# Patient Record
Sex: Female | Born: 1975 | Race: White | Hispanic: No | Marital: Married | Smoking: Former smoker
Health system: Southern US, Community
[De-identification: ages and names within clinical notes are randomized; demographics above are authoritative.]

## PROBLEM LIST (undated history)

## (undated) DIAGNOSIS — F419 Anxiety disorder, unspecified: Secondary | ICD-10-CM

## (undated) DIAGNOSIS — G43909 Migraine, unspecified, not intractable, without status migrainosus: Secondary | ICD-10-CM

## (undated) DIAGNOSIS — I10 Essential (primary) hypertension: Secondary | ICD-10-CM

## (undated) DIAGNOSIS — T7840XA Allergy, unspecified, initial encounter: Secondary | ICD-10-CM

## (undated) DIAGNOSIS — F32A Depression, unspecified: Secondary | ICD-10-CM

## (undated) DIAGNOSIS — E785 Hyperlipidemia, unspecified: Secondary | ICD-10-CM

## (undated) DIAGNOSIS — F329 Major depressive disorder, single episode, unspecified: Secondary | ICD-10-CM

## (undated) HISTORY — DX: Essential (primary) hypertension: I10

## (undated) HISTORY — PX: TUBAL LIGATION: SHX77

## (undated) HISTORY — DX: Major depressive disorder, single episode, unspecified: F32.9

## (undated) HISTORY — DX: Allergy, unspecified, initial encounter: T78.40XA

## (undated) HISTORY — DX: Hyperlipidemia, unspecified: E78.5

## (undated) HISTORY — DX: Depression, unspecified: F32.A

## (undated) HISTORY — DX: Anxiety disorder, unspecified: F41.9

## (undated) HISTORY — PX: BUNIONECTOMY: SHX129

---

## 2003-06-12 ENCOUNTER — Other Ambulatory Visit: Admission: RE | Admit: 2003-06-12 | Discharge: 2003-06-12 | Payer: Self-pay | Admitting: Obstetrics and Gynecology

## 2003-11-25 ENCOUNTER — Emergency Department (HOSPITAL_COMMUNITY): Admission: AD | Admit: 2003-11-25 | Discharge: 2003-11-25 | Payer: Self-pay | Admitting: Family Medicine

## 2004-08-01 ENCOUNTER — Other Ambulatory Visit: Admission: RE | Admit: 2004-08-01 | Discharge: 2004-08-01 | Payer: Self-pay | Admitting: Obstetrics and Gynecology

## 2005-09-03 ENCOUNTER — Other Ambulatory Visit: Admission: RE | Admit: 2005-09-03 | Discharge: 2005-09-03 | Payer: Self-pay | Admitting: Obstetrics and Gynecology

## 2006-06-19 ENCOUNTER — Inpatient Hospital Stay (HOSPITAL_COMMUNITY): Admission: AD | Admit: 2006-06-19 | Discharge: 2006-06-22 | Payer: Self-pay | Admitting: Obstetrics & Gynecology

## 2008-08-04 ENCOUNTER — Emergency Department (HOSPITAL_BASED_OUTPATIENT_CLINIC_OR_DEPARTMENT_OTHER): Admission: EM | Admit: 2008-08-04 | Discharge: 2008-08-04 | Payer: Self-pay | Admitting: Emergency Medicine

## 2010-05-01 ENCOUNTER — Observation Stay (HOSPITAL_COMMUNITY): Admission: AD | Admit: 2010-05-01 | Discharge: 2010-05-02 | Payer: Self-pay | Admitting: Obstetrics and Gynecology

## 2010-05-03 ENCOUNTER — Inpatient Hospital Stay (HOSPITAL_COMMUNITY): Admission: AD | Admit: 2010-05-03 | Discharge: 2010-05-03 | Payer: Self-pay | Admitting: Obstetrics and Gynecology

## 2010-05-10 ENCOUNTER — Inpatient Hospital Stay (HOSPITAL_COMMUNITY): Admission: AD | Admit: 2010-05-10 | Discharge: 2010-05-10 | Payer: Self-pay | Admitting: Obstetrics and Gynecology

## 2010-05-11 ENCOUNTER — Ambulatory Visit (HOSPITAL_COMMUNITY): Admission: AD | Admit: 2010-05-11 | Discharge: 2010-05-11 | Payer: Self-pay | Admitting: Obstetrics and Gynecology

## 2010-05-18 ENCOUNTER — Inpatient Hospital Stay (HOSPITAL_COMMUNITY): Admission: RE | Admit: 2010-05-18 | Discharge: 2010-05-20 | Payer: Self-pay | Admitting: Obstetrics and Gynecology

## 2011-03-17 LAB — CBC
HCT: 26.2 % — ABNORMAL LOW (ref 36.0–46.0)
HCT: 32.6 % — ABNORMAL LOW (ref 36.0–46.0)
Hemoglobin: 11.4 g/dL — ABNORMAL LOW (ref 12.0–15.0)
Hemoglobin: 9 g/dL — ABNORMAL LOW (ref 12.0–15.0)
MCV: 87.2 fL (ref 78.0–100.0)
Platelets: 158 10*3/uL (ref 150–400)
Platelets: 195 10*3/uL (ref 150–400)
RBC: 3 MIL/uL — ABNORMAL LOW (ref 3.87–5.11)
RBC: 3.83 MIL/uL — ABNORMAL LOW (ref 3.87–5.11)
RDW: 14.5 % (ref 11.5–15.5)
WBC: 10.4 10*3/uL (ref 4.0–10.5)
WBC: 7.9 10*3/uL (ref 4.0–10.5)

## 2011-03-17 LAB — RPR: RPR Ser Ql: NONREACTIVE

## 2011-03-18 LAB — URINE MICROSCOPIC-ADD ON

## 2011-03-18 LAB — CREATININE CLEARANCE, URINE, 24 HOUR
Collection Interval-CRCL: 24 hours
Creatinine Clearance: 164 mL/min — ABNORMAL HIGH (ref 75–115)
Creatinine, Urine: 45.9 mg/dL
Urine Total Volume-CRCL: 2725 mL

## 2011-03-18 LAB — COMPREHENSIVE METABOLIC PANEL
AST: 17 U/L (ref 0–37)
Albumin: 2.5 g/dL — ABNORMAL LOW (ref 3.5–5.2)
Alkaline Phosphatase: 137 U/L — ABNORMAL HIGH (ref 39–117)
Alkaline Phosphatase: 139 U/L — ABNORMAL HIGH (ref 39–117)
BUN: 4 mg/dL — ABNORMAL LOW (ref 6–23)
BUN: 4 mg/dL — ABNORMAL LOW (ref 6–23)
CO2: 22 mEq/L (ref 19–32)
Calcium: 8.5 mg/dL (ref 8.4–10.5)
Chloride: 106 mEq/L (ref 96–112)
Creatinine, Ser: 0.5 mg/dL (ref 0.4–1.2)
Creatinine, Ser: 0.64 mg/dL (ref 0.4–1.2)
GFR calc non Af Amer: >60 mL/min (ref >60)
GFR calc non Af Amer: >60 mL/min (ref >60)
Potassium: 3.5 mEq/L (ref 3.5–5.1)
Sodium: 134 mEq/L — ABNORMAL LOW (ref 135–145)
Sodium: 135 mEq/L (ref 135–145)
Sodium: 137 mEq/L (ref 135–145)
Total Bilirubin: 0.2 mg/dL — ABNORMAL LOW (ref 0.3–1.2)

## 2011-03-18 LAB — CBC
HCT: 29.8 % — ABNORMAL LOW (ref 36.0–46.0)
HCT: 32 % — ABNORMAL LOW (ref 36.0–46.0)
Hemoglobin: 10.3 g/dL — ABNORMAL LOW (ref 12.0–15.0)
Hemoglobin: 10.4 g/dL — ABNORMAL LOW (ref 12.0–15.0)
MCV: 87.9 fL (ref 78.0–100.0)
MCV: 88 fL (ref 78.0–100.0)
Platelets: 171 10*3/uL (ref 150–400)
Platelets: 180 10*3/uL (ref 150–400)
RBC: 3.4 MIL/uL — ABNORMAL LOW (ref 3.87–5.11)
RDW: 14.5 % (ref 11.5–15.5)
RDW: 14.6 % (ref 11.5–15.5)
WBC: 11.8 10*3/uL — ABNORMAL HIGH (ref 4.0–10.5)

## 2011-03-18 LAB — PROTEIN, URINE, 24 HOUR
Protein, 24H Urine: 136 mg/d — ABNORMAL HIGH (ref 50–100)
Urine Total Volume-UPROT: 2725 mL

## 2011-03-18 LAB — URIC ACID
Uric Acid, Serum: 4.6 mg/dL (ref 2.4–7.0)
Uric Acid, Serum: 4.6 mg/dL (ref 2.4–7.0)

## 2011-03-18 LAB — URINALYSIS, ROUTINE W REFLEX MICROSCOPIC
Bilirubin Urine: NEGATIVE
Glucose, UA: 250 mg/dL — AB
Leukocytes, UA: NEGATIVE
Nitrite: NEGATIVE

## 2011-03-18 LAB — LACTATE DEHYDROGENASE: LDH: 134 U/L (ref 94–250)

## 2011-03-28 ENCOUNTER — Ambulatory Visit: Payer: Self-pay | Admitting: Internal Medicine

## 2011-04-11 DIAGNOSIS — E669 Obesity, unspecified: Secondary | ICD-10-CM | POA: Insufficient documentation

## 2011-04-11 DIAGNOSIS — G43909 Migraine, unspecified, not intractable, without status migrainosus: Secondary | ICD-10-CM | POA: Insufficient documentation

## 2011-04-11 DIAGNOSIS — F32A Depression, unspecified: Secondary | ICD-10-CM | POA: Insufficient documentation

## 2011-05-16 NOTE — Op Note (Signed)
NAMEBLONDELL, LAPERLE NO.:  [id number]   MEDICAL RECORD NO.:  [id number]          PATIENT TYPE:  INP   LOCATION:  9115                          FACILITY:  [facility]   PHYSICIAN:  Freddy Finner, M.D.   DATE OF BIRTH:  [birth year]   DATE OF PROCEDURE:  06/19/2006  DATE OF DISCHARGE:  06/22/2006                                 OPERATIVE REPORT   PREOPERATIVE DIAGNOSIS:  Surgically scared uterus, intrauterine pregnancy [redacted]  weeks gestation, in labor.   POSTOPERATIVE DIAGNOSIS:  Surgically scared uterus, intrauterine pregnancy  [redacted] weeks gestation, in labor.   OPERATIVE PROCEDURE:  Repeat low transverse cervical cesarean section,  delivery of a viable female infant, Apgars 9/9, arterial cord pH 7.35.   SURGEON:  Freddy Finner, M.D.   ANESTHESIA:  Spinal.   ESTIMATED BLOOD LOSS:  Less than or equal to 800 mL.   INTEROPERATIVE COMPLICATIONS:  None.   The medical record indicates that the note was dictated but apparently was  not found or I failed to dictate the note.   INDICATIONS FOR PROCEDURE:  The patient is a 35 year old who had previously  delivered by cesarean who presented in labor at [redacted] weeks gestation.   DESCRIPTION OF PROCEDURE:  After admission, appropriate preoperative labs,  she was brought to the operating room and placed under adequate spinal  anesthesia, placed in the dorsal recumbent position with elevation of the  right hip by 15 degrees.  The abdomen was prepped and draped in the usual  fashion and a Foley catheter placed using sterile technique.  A lower  abdominal transverse incision was made through an old scar and carried  sharply down to fascia.  The fascia was entered sharply and extended the  extent of the skin incision.  The rectus sheath was developed superiorly and  inferiorly with blunt and sharp dissection.  The rectus muscles were divided  in the midline.  The peritoneum was entered sharply and extended bluntly to  the extent of the  skin incision.  A transverse incision was made in the  visceroperitoneum overlying the lower uterine segment.  The bladder blade  was dissected off the lower segment.  A transverse incision was made in the  lower uterine segment and extended bluntly in a transverse direction.  A  viable female infant was delivered.  The placenta and other parts of  conception removed from the uterus.  The uterus was delivered on the  anterior abdominal wall.  The tubes and ovaries and uterus were normal.  The  uterine incision was closed in a double layer with running locking 0  Monocryl in the first layer and then imbricating 0 Monocryl sutures for the  second layer.  The bladder flap was reapproximated with an interrupted 0  Monocryl suture.  The uterus was delivered back into the abdominal cavity.  Hemostasis was complete.  Pack, needle, and instrument counts were correct.  Irrigation was carried out.  The abdominal incision was then closed in  layers with running 0 Monocryl used to close the peritoneum and  reapproximate the rectus muscle.  The fascia was closed with running 0 PDS,  the subcutaneous tissue was approximated with running 2-0 plain, the skin  was closed with skin staples and Steri-Strips.  The patient was taken to the  recovery room in good condition.      Freddy Finner, M.D.  Electronically Signed     WRN/MEDQ  D:  08/20/2006  T:  08/20/2006  Job:  [id number]

## 2011-05-16 NOTE — Discharge Summary (Signed)
Courtney Powers, CASAD NO.:  [id number]   MEDICAL RECORD NO.:  [id number]          PATIENT TYPE:  INP   LOCATION:  9115                          FACILITY:  [facility]   PHYSICIAN:  Zelphia Cairo, MD    DATE OF BIRTH:  February 08, 1976   DATE OF ADMISSION:  06/19/2006  DATE OF DISCHARGE:  06/22/2006                                 DISCHARGE SUMMARY   ADMITTING DIAGNOSES:  1.  Intrauterine pregnancy at term.  2.  Early labor.  3.  Previous cesarean section, desires repeat.   DISCHARGE DIAGNOSES:  1.  Status post low transverse cesarean section.  2.  Viable female infant.   PROCEDURE:  Repeat low transverse cesarean section.   REASON FOR ADMISSION:  Please see written H&P.   HOSPITAL COURSE:  The patient is 29-year white married female gravida 2,  para 1 that presented to [facility] in early labor.  The  patient had a previous cesarean section and desired repeat.  The patient was  then taken to the operating room where spinal anesthesia was administered  without difficulty.  A low transverse incision was made with delivery of a  viable female infant weighing 6 pounds 12 ounces with Apgars of 9 at one  minute and 9 at five minutes.  Arterial cord pH of 7.35.  The patient  tolerated the procedure well and was taken to the recovery room in stable  condition.  On postoperative day #1, the patient was without complaint.  Vital signs were stable.  Abdomen soft with good return of bowel function.  Fundus firm and nontender.  Abdominal dressing was noted to be clean, dry  and intact.  Laboratory findings revealed hemoglobin of 12.1.  On  postoperative day #2, the patient was without complaint.  Vital signs were  stable.  Fundus firm and nontender.  Abdominal dressing had been removed  revealing an incision that is clean, dry and intact.  On postoperative day  #3, the patient denied headache, blurred vision or right upper quadrant  pain.  Vital signs were stable.   She is afebrile.  Blood pressure was noted  to be 140/92 to 128/86.  Deep tendon reflexes 1+ without clonus.  Abdomen  soft.  Fundus was firm and nontender.  Incision was noted to have some  ecchymosis noted to be superior and inferior to the incisional site.  Staples were intact.  Due to history of PIH, the PIH labs were drawn stat,  which were within normal limits.  Discharge instructions were reviewed and  the patient was later discharged home.   CONDITION ON DISCHARGE:  Stable.   DIET:  Regular as tolerated.   ACTIVITY:  No heavy lifting, no driving x2 weeks.  No vaginal entry.   FOLLOWUP:  The patient is to follow up the following morning for a blood  pressure check.  She is to call for temperature greater than 100 degrees,  persistent nausea and vomiting, heavy vaginal bleeding and/or redness or  drainage from incisional site.  The patient was also instructed to call for  headache unrelieved by Tylenol  or blurred vision, right upper quadrant pain.   DISCHARGE MEDICATIONS:  1.  Percocet 5/325, #30 one p.o. every 4-6 hours p.r.n.  2.  Motrin 600 mg every 6 hours p.r.n.  3.  Prenatal vitamins one p.o. daily.  4.  Colace one p.o. daily p.r.n.      Julio Sicks, N.P.      Zelphia Cairo, MD  Electronically Signed    CC/MEDQ  D:  07/23/2006  T:  07/23/2006  Job:  [id number]

## 2011-07-04 DIAGNOSIS — J309 Allergic rhinitis, unspecified: Secondary | ICD-10-CM | POA: Insufficient documentation

## 2011-12-17 ENCOUNTER — Other Ambulatory Visit: Payer: Self-pay | Admitting: Gastroenterology

## 2011-12-19 ENCOUNTER — Other Ambulatory Visit: Payer: Self-pay

## 2012-01-01 ENCOUNTER — Encounter: Payer: Self-pay | Admitting: Internal Medicine

## 2012-02-06 ENCOUNTER — Ambulatory Visit: Payer: Self-pay | Admitting: Internal Medicine

## 2012-05-03 DIAGNOSIS — A048 Other specified bacterial intestinal infections: Secondary | ICD-10-CM | POA: Insufficient documentation

## 2013-02-07 DIAGNOSIS — E785 Hyperlipidemia, unspecified: Secondary | ICD-10-CM | POA: Insufficient documentation

## 2013-02-07 DIAGNOSIS — E559 Vitamin D deficiency, unspecified: Secondary | ICD-10-CM | POA: Insufficient documentation

## 2013-06-14 DIAGNOSIS — K219 Gastro-esophageal reflux disease without esophagitis: Secondary | ICD-10-CM | POA: Insufficient documentation

## 2013-06-14 DIAGNOSIS — F419 Anxiety disorder, unspecified: Secondary | ICD-10-CM | POA: Insufficient documentation

## 2013-10-21 ENCOUNTER — Other Ambulatory Visit: Payer: Self-pay | Admitting: Radiology

## 2013-10-21 ENCOUNTER — Ambulatory Visit (INDEPENDENT_AMBULATORY_CARE_PROVIDER_SITE_OTHER): Payer: 59 | Admitting: Internal Medicine

## 2013-10-21 VITALS — BP 140/82 | HR 84 | Temp 98.9°F | Resp 16 | Ht 62.0 in | Wt 190.2 lb

## 2013-10-21 DIAGNOSIS — H8110 Benign paroxysmal vertigo, unspecified ear: Secondary | ICD-10-CM

## 2013-10-21 DIAGNOSIS — M542 Cervicalgia: Secondary | ICD-10-CM

## 2013-10-21 DIAGNOSIS — R51 Headache: Secondary | ICD-10-CM

## 2013-10-21 MED ORDER — MECLIZINE HCL 32 MG PO TABS: ORAL_TABLET | ORAL | Status: DC

## 2013-10-21 NOTE — Progress Notes (Signed)
This chart was scribed for Ellamae Sia, MD by Joaquin Music, ED Scribe. This patient was seen in room Room 10 and the patient's care was started at 3:52 PM   Subjective:    Patient ID: Courtney Powers, female    DOB: 11/11/1976, 37 y.o.   MRN: [id number]  HPI Courtney Powers is a 37 y.o. female who presents to the [facility] complaining of ongoing dizziness onset a few weeks. Pt describes her dizzy episodes as feeling "drunk". Pt states a few weeks ago, she states she felt she had numbness in her face. She denies lost of taste buds, droopy face, and any other abnormalities. Pt states she was still able to eat normal. Pt states she has many HA and has neck complications. Pt does not believe her HA are associated with her dizzy episodes. Pt denies change in her hearing. Pt works all day at [employer]. Pt states when she has had X-Rays done, they have told her she has "whip lash neck". Pt states she has been taking OTC Ibuprofen daily to help with her HA with no relief.     Review of Systems  Constitutional: Negative for fever, chills and fatigue.  HENT: Negative for hearing loss.   Eyes: Negative for pain.  Neurological: Positive for dizziness, numbness and headaches.       Objective:   Physical Exam BP 140/82  Pulse 84  Temp(Src) 98.9 F (37.2 C) (Oral)  Resp 16  Ht 5\' 2"  (1.575 m)  Wt 190 lb 3.2 oz (86.274 kg)  BMI 34.78 kg/m2  SpO2 100%  LMP 10/07/2013 Pupils equal round reactive to light and accommodation /EOMs conjugate /conjunctiva clear  With eye exam, pt states she feels dizzy when she looks to the right rapidly. Nares clear Throat clear No nodes or thyromegaly She is tender in the paracervical muscles bilaterally and into the right trapezius There is forward posture Heart regular without murmur Lungs clear Extremities with good pulses and no edema  Cranial nerves II through XII intact Deep tendon reflexes symmetrical No distal sensory or motor losses Gait  normal Cerebellar intact Dix-Hallpike maneuver recreates dizziness but does not produce nystagmus       Assessment & Plan:  BPV (benign positional vertigo)  Brandt-Darhoff  Meclizine at bedtime  Recheck one week if not well to consider scans Headache(784.0)  Suspect this recurrent problem is secondary to her neck dysfunction Neck pain  Postural, secondary to muscle spasm  Recommend physical therapy Dr Joanne Gavel  Meds ordered this encounter  Medications  .  meclizine (ANTIVERT) 32 MG tablet    Sig: Hs daily    Dispense:  14 tablet    Refill:  0   Close followup

## 2013-10-21 NOTE — Telephone Encounter (Signed)
Meclizine not manufactured any longer in the 32 mg dose, pharmacy indicates they do have the 25 mg in stock, please advise pended

## 2013-10-23 DIAGNOSIS — R519 Headache, unspecified: Secondary | ICD-10-CM | POA: Insufficient documentation

## 2013-10-23 MED ORDER — MECLIZINE HCL 25 MG PO TABS: 25.0000 mg | ORAL_TABLET | Freq: Every day | ORAL | Status: DC

## 2013-10-23 NOTE — Telephone Encounter (Signed)
Sent in

## 2014-04-07 ENCOUNTER — Ambulatory Visit (INDEPENDENT_AMBULATORY_CARE_PROVIDER_SITE_OTHER): Payer: 59 | Admitting: Family Medicine

## 2014-04-07 VITALS — BP 128/76 | HR 82 | Temp 98.4°F | Resp 16 | Ht 61.5 in | Wt 200.4 lb

## 2014-04-07 DIAGNOSIS — R51 Headache: Secondary | ICD-10-CM

## 2014-04-07 DIAGNOSIS — M62838 Other muscle spasm: Secondary | ICD-10-CM

## 2014-04-07 MED ORDER — CYCLOBENZAPRINE HCL 10 MG PO TABS: 10.0000 mg | ORAL_TABLET | Freq: Two times a day (BID) | ORAL | Status: DC | PRN

## 2014-04-07 NOTE — Progress Notes (Signed)
[facility] [address], [place] [address] [phone]  Date:  04/07/2014   Name:  Courtney Powers   DOB:  [birth year]   MRN:  [id number]  PCP:  No primary provider on file.    Chief Complaint: Headache   History of Present Illness:  Courtney Powers is a 38 y.o. very pleasant female patient who presents with the following:  She is here today with a possible "tnesion headache" since Monday.  She has noted possible spasms in her neck. She has tried imitrex 3x and it has not helped- she does not think it is a migraine.  Last took imitrex yesterday.   The HA is located in the back left part of her head and goes into her shoulder.   NKI, no MVA She has been a little nauseated- thinks may be due to medication use.  No vomiting, no abdominal pain She does have some light sensitivity, no phonophobia.   No numbness, weakness or visual changes.    Her LMP is due soon, she is s/p BTL  Patient Active Problem List   Diagnosis Date Noted  . Frequent headaches 10/23/2013    Past Medical History  Diagnosis Date  . Allergy   . Anxiety   . Depression     Past Surgical History  Procedure Laterality Date  . Cesarean section    . Tubal ligation    . Bunionectomy Right     Right foot    History  Substance Use Topics  . Smoking status: Former Smoker    Types: Cigarettes    Quit date: 08/21/2013  . Smokeless tobacco: Not on file  . Alcohol Use: Yes     Comment: Social drinking    Family History  Problem Relation Age of Onset  . Heart disease Mother   . Hypertension Mother   . Hypertension Father   . Diabetes Father   . Alzheimer's disease Maternal Grandmother   . Heart disease Maternal Grandfather   . Cancer Paternal Grandmother   . Stroke Paternal Grandmother   . Hyperlipidemia Paternal Grandfather   . Diabetes Paternal Grandfather     No Known Allergies  Medication list has been reviewed and updated.  Current Outpatient Prescriptions on File  Prior to Visit  Medication Sig Dispense Refill  . SUMAtriptan (IMITREX) 100 MG tablet Take 100 mg by mouth every 2 (two) hours as needed for migraine. May repeat in 2 hours if headache persists or recurs.      . meclizine (ANTIVERT) 25 MG tablet Take 1 tablet (25 mg total) by mouth at bedtime.  14 tablet  0   No current facility-administered medications on file prior to visit.    Review of Systems:  As per HPI- otherwise negative.   Physical Examination: Filed Vitals:   04/07/14 1451  BP: 128/76  Pulse: 82  Temp: 98.4 F (36.9 C)  Resp: 16   Filed Vitals:   04/07/14 1451  Height: 5' 1.5" (1.562 m)  Weight: 200 lb 6.4 oz (90.901 kg)   Body mass index is 37.26 kg/(m^2). Ideal Body Weight: Weight in (lb) to have BMI = 25: 134.2  GEN: WDWN, NAD, Non-toxic, A & O x 3, obese, looks well HEENT: Atraumatic, Normocephalic. Neck supple. No masses, No LAD.  Bilateral TM wnl, oropharynx normal.  PEERL,EOMI.   No  Meningismus. She is tender over the left paracervical muscles and left upper trapezius.  Normal neck ROM Ears and Nose: No external  deformity. CV: RRR, No M/G/R. No JVD. No thrill. No extra heart sounds. PULM: CTA B, no wheezes, crackles, rhonchi. No retractions. No resp. distress. No accessory muscle use. ABD: S, NT, ND. No rebound. No HSM. EXTR: No c/c/e NEURO Normal gait. Normal complete neuro exam including sensation and movement all extremities and face, normal DTR all extremities and negative romberg PSYCH: Normally interactive. Conversant. Not depressed or anxious appearing.  Calm demeanor.   Assessment and Plan: Cervical paraspinal muscle spasm - Plan: cyclobenzaprine (FLEXERIL) 10 MG tablet  Headache(784.0)  Treat for cervical spasm and tension HA with flexeril as needed.  We are out of cervical collars- gave rx so she can get one at the drug store and hopefully bill her insurance.   She will let us know if not better soon- Sooner if worse.     Signed Abbe AmsterdamJessica  Iman Orourke, MD

## 2014-04-07 NOTE — Patient Instructions (Signed)
Use the flexeril as needed for muscle spasm and pain.  If a whole pill is too strong try a 1/2 pill.  Heat may also be helpful, and use your soft collar as needed.   If you are not better in the next few days please let me know- Sooner if worse.   It is better not to combine imitrex and flexeril- this can increase your risk of a rare reaction called serotonin syndrome.   Remember the flexeril will make you sleepy, so do not use it when you need to drive.

## 2015-06-20 ENCOUNTER — Other Ambulatory Visit: Payer: Self-pay | Admitting: Family Medicine

## 2015-10-02 ENCOUNTER — Encounter (HOSPITAL_BASED_OUTPATIENT_CLINIC_OR_DEPARTMENT_OTHER): Payer: Self-pay | Admitting: Emergency Medicine

## 2015-10-02 ENCOUNTER — Emergency Department (HOSPITAL_BASED_OUTPATIENT_CLINIC_OR_DEPARTMENT_OTHER)
Admission: EM | Admit: 2015-10-02 | Discharge: 2015-10-02 | Disposition: A | Discharge status: Home / Self Care | Payer: 59 | Attending: Emergency Medicine | Admitting: Emergency Medicine

## 2015-10-02 DIAGNOSIS — Z8659 Personal history of other mental and behavioral disorders: Secondary | ICD-10-CM | POA: Diagnosis not present

## 2015-10-02 DIAGNOSIS — R51 Headache: Secondary | ICD-10-CM | POA: Diagnosis present

## 2015-10-02 DIAGNOSIS — Z79899 Other long term (current) drug therapy: Secondary | ICD-10-CM | POA: Insufficient documentation

## 2015-10-02 DIAGNOSIS — R519 Headache, unspecified: Secondary | ICD-10-CM

## 2015-10-02 DIAGNOSIS — Z87891 Personal history of nicotine dependence: Secondary | ICD-10-CM | POA: Diagnosis not present

## 2015-10-02 DIAGNOSIS — G43909 Migraine, unspecified, not intractable, without status migrainosus: Secondary | ICD-10-CM | POA: Diagnosis not present

## 2015-10-02 HISTORY — DX: Migraine, unspecified, not intractable, without status migrainosus: G43.909

## 2015-10-02 MED ORDER — DIPHENHYDRAMINE HCL 50 MG/ML IJ SOLN
25.0000 mg | Freq: Once | INTRAMUSCULAR | Status: AC
  Administered 2015-10-02: 25 mg via INTRAVENOUS
  Filled 2015-10-02: qty 1

## 2015-10-02 MED ORDER — KETOROLAC TROMETHAMINE 30 MG/ML IJ SOLN
30.0000 mg | Freq: Once | INTRAMUSCULAR | Status: AC
  Administered 2015-10-02: 30 mg via INTRAVENOUS
  Filled 2015-10-02: qty 1

## 2015-10-02 MED ORDER — SODIUM CHLORIDE 0.9 % IV BOLUS (SEPSIS)
1000.0000 mL | Freq: Once | INTRAVENOUS | Status: AC
  Administered 2015-10-02: 1000 mL via INTRAVENOUS

## 2015-10-02 MED ORDER — METOCLOPRAMIDE HCL 5 MG/ML IJ SOLN
10.0000 mg | Freq: Once | INTRAMUSCULAR | Status: AC
  Administered 2015-10-02: 10 mg via INTRAVENOUS
  Filled 2015-10-02: qty 2

## 2015-10-02 NOTE — ED Notes (Signed)
Pt report awoken from sleep by migraine headache pain 7/10 Hx migraines in the past and [facility] [place].

## 2015-10-02 NOTE — ED Provider Notes (Signed)
CSN: [id number]     Arrival date & time 10/02/15  0335 History   First MD Initiated Contact with Patient 10/02/15 0350     Chief Complaint  Patient presents with  . Migraine     (Consider location/radiation/quality/duration/timing/severity/associated sxs/prior Treatment) HPI  This is a 39 year old female with a history of migraines. She is currently on a study drug and thus was not permitted to take her usual sumatriptan. She was awakened from sleep earlier this morning by a headache. It is characterized as throbbing and like previous migraines. The pain is primarily occipital although it also moves to the front when she stands up. She rates her pain a 7 out of 10. It is worse with exposure to light. She is also nauseated but has not vomited. She denies blurred vision or focal neurologic changes.  Past Medical History  Diagnosis Date  . Allergy   . Anxiety   . Depression    Past Surgical History  Procedure Laterality Date  . Cesarean section    . Tubal ligation    . Bunionectomy Right     Right foot   Family History  Problem Relation Age of Onset  . Heart disease Mother   . Hypertension Mother   . Hypertension Father   . Diabetes Father   . Alzheimer's disease Maternal Grandmother   . Heart disease Maternal Grandfather   . Cancer Paternal Grandmother   . Stroke Paternal Grandmother   . Hyperlipidemia Paternal Grandfather   . Diabetes Paternal Grandfather    Social History  Substance Use Topics  . Smoking status: Former Smoker    Types: Cigarettes    Quit date: 08/21/2013  . Smokeless tobacco: None  . Alcohol Use: Yes     Comment: Social drinking   OB History    No data available     Review of Systems  All other systems reviewed and are negative.   Allergies  Review of patient's allergies indicates no known allergies.  Home Medications   Prior to Admission medications   Medication Sig Start Date End Date Taking? Authorizing Provider  cyclobenzaprine  (FLEXERIL) 10 MG tablet Take 1 tablet (10 mg total) by mouth 2 (two) times daily as needed for muscle spasms. 04/07/14   Pearline Cables, MD  meclizine (ANTIVERT) 25 MG tablet Take 1 tablet (25 mg total) by mouth at bedtime. 10/21/13   Tonye Pearson, MD  SUMAtriptan (IMITREX) 100 MG tablet Take 100 mg by mouth every 2 (two) hours as needed for migraine. May repeat in 2 hours if headache persists or recurs.    Historical Provider, MD   BP 143/87 mmHg  Pulse 92  Temp(Src) 98.6 F (37 C) (Oral)  Resp 18  SpO2 94%  LMP 09/13/2015   Physical Exam  General: Well-developed, well-nourished female in no acute distress; appearance consistent with age of record HENT: normocephalic; atraumatic Eyes: pupils equal, round and reactive to light; extraocular muscles intact; photophobia Neck: supple Heart: regular rate and rhythm Lungs: clear to auscultation bilaterally Abdomen: soft; nondistended; nontender; bowel sounds present Extremities: No deformity; full range of motion; pulses normal Neurologic: Awake, alert and oriented; motor function intact in all extremities and symmetric; no facial droop; normal coordination and speech Skin: Warm and dry Psychiatric: Normal mood and affect    ED Course  Procedures (including critical care time)   MDM  4:52 AM Symptoms relieved with IV medications.  Paula Libra, MD 10/02/15 [phone]

## 2015-10-02 NOTE — ED Notes (Signed)
Pt verbalizes understanding of d/c instructions and denies any further needs at this time. 

## 2017-02-23 DIAGNOSIS — R079 Chest pain, unspecified: Secondary | ICD-10-CM | POA: Diagnosis not present

## 2017-02-23 DIAGNOSIS — R0781 Pleurodynia: Secondary | ICD-10-CM | POA: Diagnosis not present

## 2017-04-02 ENCOUNTER — Ambulatory Visit (INDEPENDENT_AMBULATORY_CARE_PROVIDER_SITE_OTHER): Payer: 59 | Admitting: Physician Assistant

## 2017-04-02 ENCOUNTER — Ambulatory Visit (INDEPENDENT_AMBULATORY_CARE_PROVIDER_SITE_OTHER): Payer: 59

## 2017-04-02 VITALS — BP 136/84 | HR 75 | Temp 98.8°F | Resp 18 | Ht 62.0 in | Wt 197.0 lb

## 2017-04-02 DIAGNOSIS — M79645 Pain in left finger(s): Secondary | ICD-10-CM

## 2017-04-02 DIAGNOSIS — S62653A Nondisplaced fracture of medial phalanx of left middle finger, initial encounter for closed fracture: Secondary | ICD-10-CM

## 2017-04-02 NOTE — Patient Instructions (Addendum)
You have a nondisplaced fracture of your finger.  Come back in 2 weeks for repeat x-ray.  We need to keep you in a splint for 3-4 weeks.   Active exersice should start after 1-2 weeks to avoid stiffening joint.  Remain in buddy tape 6-8 weeks.  Be aware that your jour joint may remain swollen for 6 months.  Ibuprofen and tylenol for pain. You can take these at the same time.   Thank you for coming in today. I hope you feel we met your needs.  Feel free to call [facility] if you have any questions or further requests.  Please consider signing up for MyChart if you do not already have it, as this is a great way to communicate with me.  Best,  Whitney McVey, PA-C   Finger Fracture A finger fracture is a break in any of the bones of the fingers. What are the causes? The main cause of finger fractures is traumatic injury, such as from:  An injury while playing sports.  A workplace injury.  A fall. What increases the risk? Activities that can increase your risk of finger fractures include:  Sports.  Workplace activities that involve machinery.  A condition called osteoporosis, which can make your bones less dense and cause them to fracture more easily. What are the signs or symptoms? The main symptoms of a broken finger are pain, bruising, and swelling shortly after the injury. Other symptoms include:  Bruising of your finger.  Stiffness of your finger.  Exposed bones (compound or open fracture) if the fracture is severe. How is this diagnosed? This condition is diagnosed based on a physical exam, your medical history, and your symptoms. An X-ray will also be done. How is this treated? Treatment for this condition depends on the severity of the fracture. If the bones are still in place, the finger may be splinted to keep the finger still while it heals (immobilization). If the bones are out of place, they will need to be put back into place. This may be done without surgery, or  surgery may be needed. You may also be given exercises to do to regain strength and flexibility (physical therapy). Follow these instructions at home: If you have a splint:   Wear the splint as told by your health care provider. Remove it only as told by your health care provider.  Loosen the splint if your fingers tingle, become numb, or turn cold and blue.  Keep the splint clean.  If the splint is not waterproof:  Do not let it get wet.  Cover it with a watertight covering when you take a bath or a shower. Managing pain, stiffness, and swelling   If directed, put ice on the injured area:  If you have a removable splint, remove it as told by your health care provider.  Put ice in a plastic bag.  Place a towel between your skin and the bag.  Leave the ice on for 20 minutes, 2-3 times a day.  Move your fingers often to avoid stiffness and to lessen swelling.  Raise (elevate) the injured area above the level of your heart while you are sitting or lying down. Driving   Do not drive or use heavy machinery while taking prescription pain medicine.  Ask your health care provider when it is safe to drive if you have a splint. General instructions   Do not put pressure on any part of the splint until it is fully hardened. This may  take several hours.  Do not use any products that contain nicotine or tobacco, such as cigarettes and e-cigarettes. These can delay bone healing. If you need help quitting, ask your health care provider.  Take over-the-counter and prescription medicines only as told by your health care provider.  Do exercises as told by your health care provider.  Keep all follow-up visits as told by your health care provider. This is important. Contact a health care provider if:  Your pain or swelling gets worse even with treatment.  You have trouble moving your finger. Get help right away if:  Your finger becomes numb or blue. Summary  A finger fracture is  a break in any of the bones of the fingers.  Traumatic injury is the main cause of finger fractures.  Treatment for this condition depends on the severity of the fracture. This information is not intended to replace advice given to you by your health care provider. Make sure you discuss any questions you have with your health care provider. Document Released: 03/29/2001 Document Revised: 11/04/2016 Document Reviewed: 11/04/2016 Elsevier Interactive Patient Education  2017 [employer].

## 2017-04-02 NOTE — Progress Notes (Signed)
   CORTEZ STEELMAN  MRN: [id number] DOB: Feb 02, 1976  PCP: No PCP Per Patient  Subjective:  Pt is a 41 year old female who presents to clinic for finger injury.  She was at a party drinking alcohol 5 nights ago. The next morning she woke up with pain in her left middle finger. She thinks she may have sat on her finger.   Pain worse in the middle joint of her finger.  Pain is worse with movement/use. Swells after use - like doing dishes. She has not applied ice or tried not using it.  Denies redness, reduced range of motion, weakness, n/t.  Review of Systems  Constitutional: Negative for chills, fatigue and fever.  Musculoskeletal: Positive for arthralgias (L middle finger) and joint swelling (L middle finger).  Skin: Negative.   Neurological: Negative for numbness.    Patient Active Problem List   Diagnosis Date Noted  . Frequent headaches 10/23/2013    Current Outpatient Prescriptions on File Prior to Visit  Medication Sig Dispense Refill  . SUMAtriptan (IMITREX) 100 MG tablet Take 100 mg by mouth every 2 (two) hours as needed for migraine. May repeat in 2 hours if headache persists or recurs.     No current facility-administered medications on file prior to visit.     No Known Allergies   Objective:  BP (!) 141/88   Pulse 75   Temp 98.8 F (37.1 C) (Oral)   Resp 18   Ht  (1.575 m)   Wt 197 lb (89.4 kg)   LMP 03/18/2017   SpO2 98%   BMI 36.03 kg/m   Physical Exam  Constitutional: She is oriented to person, place, and time and well-developed, well-nourished, and in no distress. No distress.  Cardiovascular: Normal rate, regular rhythm and normal heart sounds.   Musculoskeletal:       Hands: Neurological: She is alert and oriented to person, place, and time. GCS score is 15.  Skin: Skin is warm and dry.  Psychiatric: Mood, memory, affect and judgment normal.  Vitals reviewed.  Dg Finger Middle Left  Result Date: 04/02/2017 CLINICAL DATA:  Pain to the PIP  joint EXAM: LEFT MIDDLE FINGER 2+V COMPARISON:  None. FINDINGS: Possible nondisplaced fracture at the base of the third middle phalanx on the oblique view. No subluxation. IMPRESSION: Possible nondisplaced fracture base of the third middle phalanx. Electronically Signed   By: Jasmine Pang M.D.   On: 04/02/2017 16:25    Assessment and Plan :  1. Closed nondisplaced fracture of middle phalanx of left middle finger, initial encounter 2. Pain of finger of left hand - DG Finger Middle Left; Future - Volar splint applied and buddy taped.  - RTC in 2 weeks for repeat x-ray.  - Con't splint application 3-4 weeks total. Remain in buddy tape 6-8 weeks total. She needs to start intermittent passive finger movement/exercises after 2 weeks to prevent finger stiffness. Ibuprofen and tylenol for pain. Information regarding these instructions printed out for pt.   Marco Collie, PA-C  Primary Care at [facility] 04/02/2017 3:49 PM

## 2017-04-16 ENCOUNTER — Ambulatory Visit (INDEPENDENT_AMBULATORY_CARE_PROVIDER_SITE_OTHER): Payer: 59

## 2017-04-16 ENCOUNTER — Ambulatory Visit (INDEPENDENT_AMBULATORY_CARE_PROVIDER_SITE_OTHER): Payer: 59 | Admitting: Physician Assistant

## 2017-04-16 VITALS — BP 137/85 | HR 79 | Temp 98.1°F | Resp 18 | Ht 62.0 in | Wt 198.0 lb

## 2017-04-16 DIAGNOSIS — Z8781 Personal history of (healed) traumatic fracture: Secondary | ICD-10-CM

## 2017-04-16 DIAGNOSIS — M7989 Other specified soft tissue disorders: Secondary | ICD-10-CM

## 2017-04-16 NOTE — Progress Notes (Signed)
   Courtney Powers  MRN: [id number] DOB: 06-20-1976  PCP: No PCP Per Patient  Subjective:  Pt is a 40 year presents to clinic for f/u finger fracture.   Initial OV 4/5 - She was at a party drinking alcohol. The next morning she woke up with pain in her left middle finger. She thinks she may have sat on her finger.   Denies redness, reduced range of motion, weakness, n/t.  Today she reports decreased in pain, however c/o swelling. She says she keeps bumping her knuckle on accident. She is keeping her fengers buddy taped together.  Denies increased joint pain or tenderness, redness, increased warmth, fever, chills.    Review of Systems  Constitutional: Negative for chills, fatigue and fever.  Cardiovascular: Negative for chest pain and palpitations.  Musculoskeletal: Positive for arthralgias (finger).  Skin: Negative for color change, pallor and wound.  Neurological: Negative for weakness and numbness.    Patient Active Problem List   Diagnosis Date Noted  . Frequent headaches 10/23/2013    Current Outpatient Prescriptions on File Prior to Visit  Medication Sig Dispense Refill  . SUMAtriptan (IMITREX) 100 MG tablet Take 100 mg by mouth every 2 (two) hours as needed for migraine. May repeat in 2 hours if headache persists or recurs.     No current facility-administered medications on file prior to visit.     No Known Allergies   Objective:  BP 137/85   Pulse 79   Temp 98.1 F (36.7 C) (Oral)   Resp 18   Ht  (1.575 m)   Wt 198 lb (89.8 kg)   LMP 03/18/2017   SpO2 98%   BMI 36.21 kg/m   Physical Exam  Constitutional: She is oriented to person, place, and time and well-developed, well-nourished, and in no distress. No distress.  Cardiovascular: Normal rate, regular rhythm and normal heart sounds.   Musculoskeletal:       Hands: Neurological: She is alert and oriented to person, place, and time. GCS score is 15.  Skin: Skin is warm and dry.  Psychiatric: Mood,  memory, affect and judgment normal.  Vitals reviewed.   Dg Finger Middle Left  Result Date: 04/16/2017 CLINICAL DATA:  Followup possible nondisplaced fracture of the base of the third middle phalanx. EXAM: LEFT MIDDLE FINGER 2+V COMPARISON:  04/02/2017. FINDINGS: Increased soft tissue swelling along the ulnar aspect of the third PIP joint. No fracture or dislocation seen on today's views. IMPRESSION: Increased soft tissue swelling at the third PIP joint with no visible fracture today. Electronically Signed   By: Beckie Salts M.D.   On: 04/16/2017 18:21    Assessment and Plan :  1. History of fracture of finger 2. Finger swelling - DG Finger Middle Left; Future - X-ray negative for fracture. Healing appropriately. Recommend a protective splint to prevent trauma. Advised elevation and ice. Encouraged pt to start passive ROM exercises starting now. Remain buddy taped for a total of 6-8 weeks. RTC if condition worsens.  Ibuprofen and tylenol for pain.  Marco Collie, PA-C  Primary Care at [facility] 04/16/2017 4:20 PM

## 2017-04-16 NOTE — Patient Instructions (Addendum)
[facility] Address: [address], [place], [place] [address] Phone: [phone]  Try to keep your finger elevated and iced as much as possible for swelling. Only 20 min out of an hour for ice. Do not put ice directly to skin.   We need to keep you in a splint for 1-2 more weeks.   Active exersice should start this week to avoid stiffening joint.  Remain in buddy tape for a total of 6-8 weeks.  Be aware that your jour joint may remain swollen for 6 months.  Ibuprofen and tylenol for pain. You can take these at the same time.   Thank you for coming in today. I hope you feel we met your needs.  Feel free to call [facility] if you have any questions or further requests.  Please consider signing up for MyChart if you do not already have it, as this is a great way to communicate with me.  Best,  Whitney McVey, PA-C  IF you received an x-ray today, you will receive an invoice from [facility]. Please contact [facility] at [phone] with questions or concerns regarding your invoice.   IF you received labwork today, you will receive an invoice from [facility]. Please contact [facility] at [phone] with questions or concerns regarding your invoice.   Our billing staff will not be able to assist you with questions regarding bills from these companies.  You will be contacted with the lab results as soon as they are available. The fastest way to get your results is to activate your My Chart account. Instructions are located on the last page of this paperwork. If you have not heard from [place] regarding the results in 2 weeks, please contact this office.

## 2017-11-26 DIAGNOSIS — N760 Acute vaginitis: Secondary | ICD-10-CM | POA: Diagnosis not present

## 2018-01-12 DIAGNOSIS — Z01419 Encounter for gynecological examination (general) (routine) without abnormal findings: Secondary | ICD-10-CM | POA: Diagnosis not present

## 2018-04-29 DIAGNOSIS — Z1389 Encounter for screening for other disorder: Secondary | ICD-10-CM | POA: Diagnosis not present

## 2018-04-29 DIAGNOSIS — I10 Essential (primary) hypertension: Secondary | ICD-10-CM | POA: Diagnosis not present

## 2018-06-22 IMAGING — DX DG FINGER MIDDLE 2+V*L*
3 series · 3 of 3 positions shown · non-contrast
Comparison: None.

CLINICAL DATA: Pain to the PIP joint

EXAM:
LEFT MIDDLE FINGER 2+V

[finger ap]
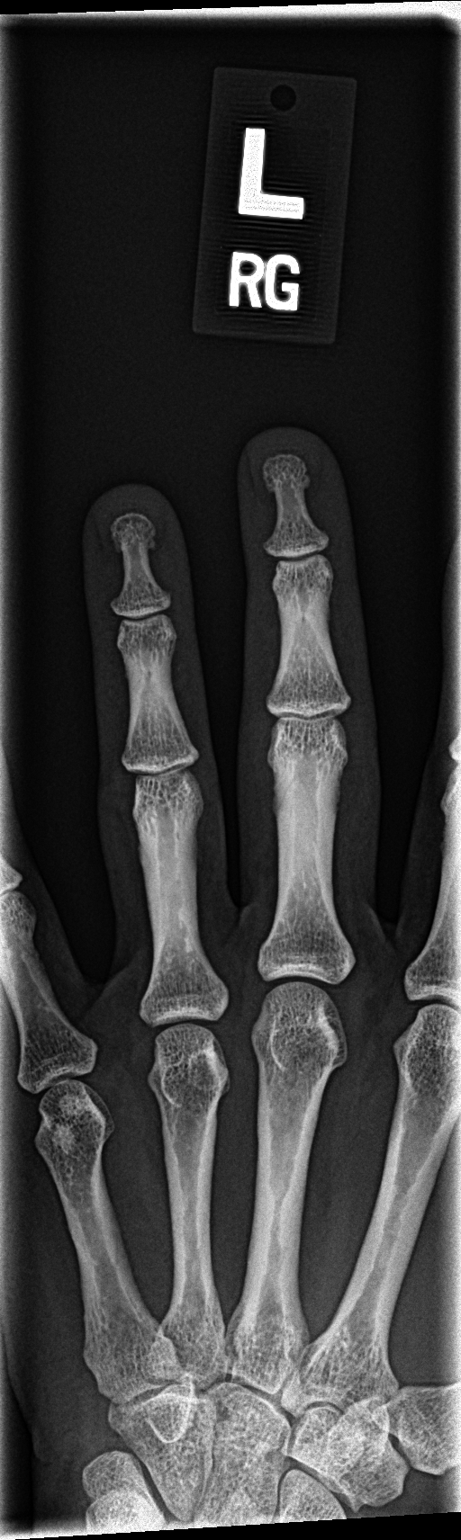

[finger obl]
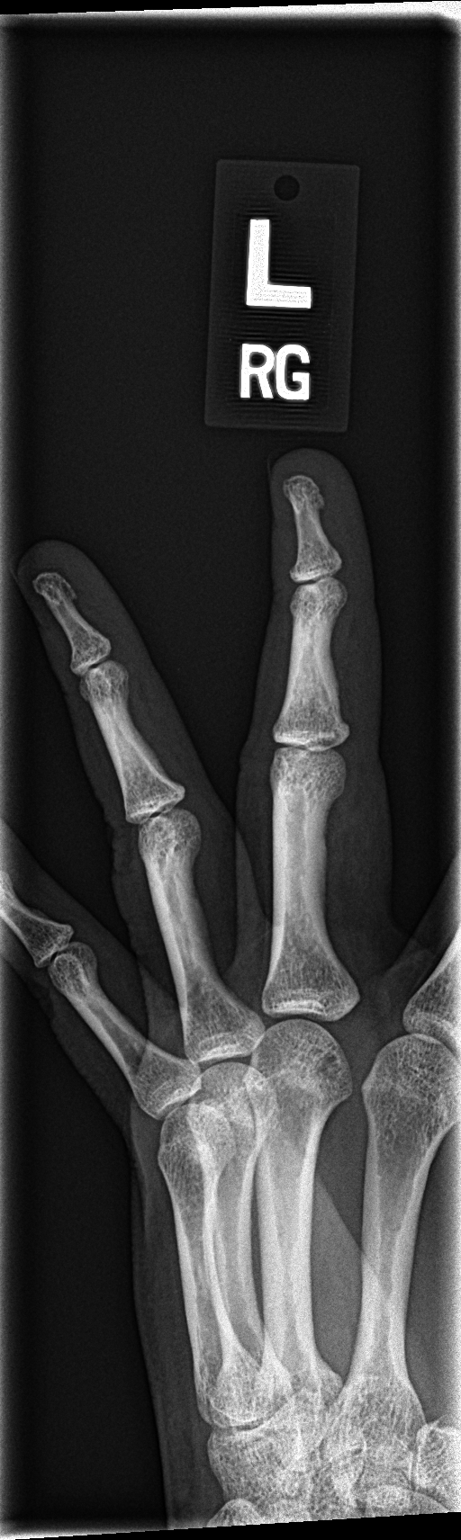

[finger lat]
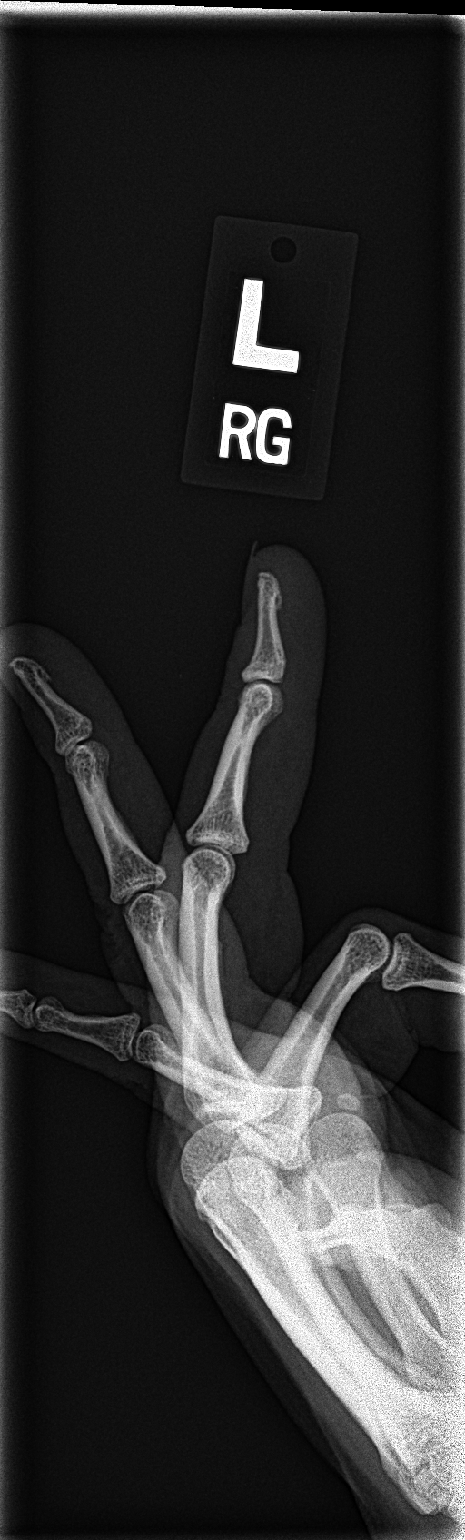

[3 of 3 positions shown; findings below may reference images not displayed]

FINDINGS: Possible nondisplaced fracture at the base of the third middle
phalanx on the oblique view. No subluxation.
IMPRESSION: Possible nondisplaced fracture base of the third middle phalanx.

## 2018-10-04 DIAGNOSIS — Z23 Encounter for immunization: Secondary | ICD-10-CM | POA: Diagnosis not present

## 2018-12-27 ENCOUNTER — Other Ambulatory Visit: Payer: Self-pay | Admitting: Orthopedic Surgery

## 2018-12-27 DIAGNOSIS — M545 Low back pain: Principal | ICD-10-CM

## 2018-12-27 DIAGNOSIS — G8929 Other chronic pain: Secondary | ICD-10-CM

## 2019-01-03 ENCOUNTER — Other Ambulatory Visit: Payer: Self-pay | Admitting: Orthopedic Surgery

## 2019-01-03 DIAGNOSIS — M545 Low back pain, unspecified: Secondary | ICD-10-CM

## 2019-01-06 ENCOUNTER — Ambulatory Visit
Admission: RE | Admit: 2019-01-06 | Discharge: 2019-01-06 | Disposition: A | Discharge status: Home / Self Care | Payer: 59 | Source: Ambulatory Visit | Attending: Orthopedic Surgery | Admitting: Orthopedic Surgery

## 2019-01-06 DIAGNOSIS — M545 Low back pain, unspecified: Secondary | ICD-10-CM

## 2019-01-10 ENCOUNTER — Other Ambulatory Visit: Payer: Self-pay | Admitting: Orthopedic Surgery

## 2019-01-10 ENCOUNTER — Ambulatory Visit
Admission: RE | Admit: 2019-01-10 | Discharge: 2019-01-10 | Disposition: A | Discharge status: Home / Self Care | Payer: 59 | Source: Ambulatory Visit | Attending: Orthopedic Surgery | Admitting: Orthopedic Surgery

## 2019-01-10 DIAGNOSIS — M545 Low back pain, unspecified: Secondary | ICD-10-CM

## 2019-01-10 DIAGNOSIS — G8929 Other chronic pain: Secondary | ICD-10-CM

## 2019-01-10 MED ORDER — IOPAMIDOL (ISOVUE-M 200) INJECTION 41%
1.0000 mL | Freq: Once | INTRAMUSCULAR | Status: AC
  Administered 2019-01-10: 1 mL via INTRA_ARTICULAR

## 2019-01-10 MED ORDER — METHYLPREDNISOLONE ACETATE 40 MG/ML INJ SUSP (RADIOLOG
120.0000 mg | Freq: Once | INTRAMUSCULAR | Status: AC
  Administered 2019-01-10: 120 mg via INTRA_ARTICULAR

## 2019-01-10 NOTE — Discharge Instructions (Signed)

## 2019-01-14 ENCOUNTER — Other Ambulatory Visit: Payer: 59

## 2019-07-18 DIAGNOSIS — R072 Precordial pain: Secondary | ICD-10-CM | POA: Insufficient documentation

## 2021-06-19 DIAGNOSIS — A6000 Herpesviral infection of urogenital system, unspecified: Secondary | ICD-10-CM | POA: Insufficient documentation

## 2022-03-13 ENCOUNTER — Encounter (HOSPITAL_BASED_OUTPATIENT_CLINIC_OR_DEPARTMENT_OTHER): Payer: Self-pay | Admitting: Family Medicine

## 2022-03-13 ENCOUNTER — Ambulatory Visit (HOSPITAL_BASED_OUTPATIENT_CLINIC_OR_DEPARTMENT_OTHER): Payer: 59 | Admitting: Family Medicine

## 2022-03-13 ENCOUNTER — Other Ambulatory Visit: Payer: Self-pay

## 2022-03-13 DIAGNOSIS — F17200 Nicotine dependence, unspecified, uncomplicated: Secondary | ICD-10-CM | POA: Insufficient documentation

## 2022-03-13 DIAGNOSIS — I10 Essential (primary) hypertension: Secondary | ICD-10-CM | POA: Insufficient documentation

## 2022-03-13 MED ORDER — LISINOPRIL 10 MG PO TABS: 10.0000 mg | ORAL_TABLET | Freq: Every day | ORAL | 1 refills | Status: DC

## 2022-03-13 NOTE — Patient Instructions (Signed)
?  Medication Instructions:  ?Your physician recommends that you continue on your current medications as directed. Please refer to the Current Medication list given to you today. ?--If you need a refill on any your medications before your next appointment, please call your pharmacy first. If no refills are authorized on file call the office.-- ? ? ? ?Follow-Up: ?Your next appointment:   ?Your physician recommends that you schedule a follow-up appointment in: 1-2 months for CPE, nurse visit one week before for labs with Dr. Tommi Rumps [place] ? ?You will receive a text message or e-mail with a link to a survey about your care and experience with [place] today! We would greatly appreciate your feedback!  ? ?Thanks for letting us be apart of your health journey!!  ?Primary Care and Sports Medicine  ? ?Dr. Marcy Salvo de [place]  ? ?We encourage you to activate your patient portal called "MyChart".  Sign up information is provided on this After Visit Summary.  MyChart is used to connect with patients for Virtual Visits (Telemedicine).  Patients are able to view lab/test results, encounter notes, upcoming appointments, etc.  Non-urgent messages can be sent to your provider as well. To learn more about what you can do with MyChart, please visit --  [email or web address].   ? ?

## 2022-03-13 NOTE — Progress Notes (Signed)
? ?New Patient Office Visit ? ?Subjective:  ?Patient ID: Courtney Powers, female    DOB: 21-Oct-1976  Age: 46 y.o. MRN: [id number] ? ?CC:  ?Chief Complaint  ?Patient presents with  ? New Patient (Initial Visit)  ?  Patient presents today to establish care. No concerns at this time, she would like to come back for fasting labs. She stated she is needing a RF on lisinopril.  ? ? ?HPI ?Courtney Powers is a 46 year old female presenting to establish in clinic.  She has current concerns as outlined above.  Reports past medical period of hypertension, anxiety/depression, tobacco use disorder. ? ?Hypertension: Currently manages with lisinopril 10 mg, has been on this medication for about 3 years, denies any issues with this.  Does not check her blood pressure at home, denies any issues with chest pain.  Does have occasional headaches with known history of migraines.  No issues with lightheadedness or dizziness.  Requesting refill of lisinopril today. ? ?Anxiety/depression: Currently utilizing Prozac 20 mg, does indicate that she has been on this medication for about 1 year however is somewhat inconsistent regarding taking this medication.  She has also been on Wellbutrin and BuSpar in the past.  With BuSpar, she indicates that it made her feel "out of it".  Generally she feels like her depressive symptoms are controlled, however feels better control of underlying anxiety is not optimized. ? ?Tobacco use disorder: Currently smokes about half a pack of cigarettes per day.  Indicates that she only started over the last few years and does want to quit smoking. ? 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  Outside of work, she enjoys fishing, walking, reading. ? ?She does indicate a family history of diabetes, heart disease, COPD.  In particular, her mother did have heart disease and she reports that she had a heart attack in her early 42s. ? ?Past Medical History:  ?Diagnosis Date  ? Allergy    ? Anxiety   ? Depression   ? Migraine   ? ? ?Past Surgical History:  ?Procedure Laterality Date  ? BUNIONECTOMY Right   ? Right foot  ? CESAREAN SECTION    ? TUBAL LIGATION    ? ? ?Family History  ?Problem Relation Age of Onset  ? Heart disease Mother   ? Hypertension Mother   ? Hypertension Father   ? Diabetes Father   ? Alzheimer's disease Maternal Grandmother   ? Heart disease Maternal Grandfather   ? Cancer Paternal Grandmother   ? Stroke Paternal Grandmother   ? Hyperlipidemia Paternal Grandfather   ? Diabetes Paternal Grandfather   ? ? ?Social History  ? ?Socioeconomic History  ? Marital status: Married  ?  Spouse name: Not on file  ? Number of children: Not on file  ? Years of education: Not on file  ? Highest education level: Not on file  ?Occupational History  ? Not on file  ?Tobacco Use  ? Smoking status: Not on file  ? Smokeless tobacco: Never  ?Substance and Sexual Activity  ? Alcohol use: Yes  ?  Comment: Social drinking  ? Drug use: No  ? Sexual activity: Not on file  ?Other Topics Concern  ? Not on file  ?Social History Narrative  ? ** Merged History Encounter **  ?    ? ?Social Determinants of Health  ? ?Financial Resource Strain: Not on file  ?Food Insecurity: Not on file  ?Transportation Needs: Not on file  ?  Physical Activity: Not on file  ?Stress: Not on file  ?Social Connections: Not on file  ?Intimate Partner Violence: Not on file  ? ? ?Objective:  ? ?Today's Vitals: BP 128/72   Pulse 99   Ht 5\' 2"  (1.575 m)   Wt 219 lb 3.2 oz (99.4 kg)   SpO2 99%   BMI 40.09 kg/m?  ? ?Physical Exam ? ?46 year old female in no acute distress ?Cardiovascular exam with regular rate and rhythm, no murmur appreciated ?Lungs clear to auscultation bilaterally ? ?Assessment & Plan:  ? ?Problem List Items Addressed This Visit   ? ?  ? Cardiovascular and Mediastinum  ? Primary hypertension  ?  Can continue with lisinopril, refill provided today ?Recommend intermittent monitoring of blood pressure at home, DASH  diet, regular aerobic exercise ?  ?  ? Relevant Medications  ? lisinopril (ZESTRIL) 10 MG tablet  ?  ? Other  ? Tobacco use disorder  ?  Discussed recommendation for smoking cessation.  Discussed various options including quitting cold 55, utilizing nicotine replacement therapy with gum, patches, lozenges, pharmacotherapy including Chantix.  Generally, would avoid trying to utilize vaping and alternative to assist with cessation ?Patient does have some hesitancy regarding Chantix and potential side effects ?Indicates that she plans to initially try nicotine replacement therapy ?Will monitor progress with this at future visits ?Did particularly review importance of modifying any risk factors given her family history with her mother having a heart attack at a young age. ?Will need to calculate ASCVD risk score once labs completed and determine if further evaluation is needed including CAC scoring, evaluation with cardiology ?  ?  ? ?Other Visit Diagnoses   ? ? Wellness examination      ? Relevant Orders  ? CBC with Differential/Platelet  ? Comprehensive metabolic panel  ? Hemoglobin A1c  ? Lipid panel  ? TSH Rfx on Abnormal to Free T4  ? ?  ? ? ?Outpatient Encounter Medications as of 03/13/2022  ?Medication Sig  ? FLUoxetine (PROZAC) 20 MG tablet Take 20 mg by mouth daily.  ? omeprazole (PRILOSEC) 20 MG capsule Take 20 mg by mouth daily.  ? [DISCONTINUED] lisinopril (ZESTRIL) 10 MG tablet Take 10 mg by mouth daily.  ? lisinopril (ZESTRIL) 10 MG tablet Take 1 tablet (10 mg total) by mouth daily.  ? SUMAtriptan (IMITREX) 100 MG tablet Take 100 mg by mouth every 2 (two) hours as needed for migraine. May repeat in 2 hours if headache persists or recurs.  ? ?No facility-administered encounter medications on file as of 03/13/2022.  ? ? ?Follow-up: Return in about 2 months (around 05/13/2022).  Plan for follow-up in about 1 to 2 months for CPE, nurse visit for labs 1 week prior ? ?Ellon Marasco J De 05/15/2022, MD ? ?

## 2022-03-13 NOTE — Assessment & Plan Note (Signed)
Can continue with lisinopril, refill provided today ?Recommend intermittent monitoring of blood pressure at home, DASH diet, regular aerobic exercise ?

## 2022-03-13 NOTE — Assessment & Plan Note (Addendum)
Discussed recommendation for smoking cessation.  Discussed various options including quitting cold [place], utilizing nicotine replacement therapy with gum, patches, lozenges, pharmacotherapy including Chantix.  Generally, would avoid trying to utilize vaping and alternative to assist with cessation ?Patient does have some hesitancy regarding Chantix and potential side effects ?Indicates that she plans to initially try nicotine replacement therapy ?Will monitor progress with this at future visits ?Did particularly review importance of modifying any risk factors given her family history with her mother having a heart attack at a young age. ?Will need to calculate ASCVD risk score once labs completed and determine if further evaluation is needed including CAC scoring, evaluation with cardiology ?

## 2022-05-06 ENCOUNTER — Ambulatory Visit (HOSPITAL_BASED_OUTPATIENT_CLINIC_OR_DEPARTMENT_OTHER): Payer: 59

## 2022-05-13 ENCOUNTER — Encounter (HOSPITAL_BASED_OUTPATIENT_CLINIC_OR_DEPARTMENT_OTHER): Payer: 59 | Admitting: Family Medicine

## 2022-06-09 ENCOUNTER — Ambulatory Visit (HOSPITAL_BASED_OUTPATIENT_CLINIC_OR_DEPARTMENT_OTHER): Payer: 59

## 2022-06-09 ENCOUNTER — Other Ambulatory Visit (HOSPITAL_BASED_OUTPATIENT_CLINIC_OR_DEPARTMENT_OTHER): Payer: Self-pay

## 2022-06-09 DIAGNOSIS — Z0000 Encounter for general adult medical examination without abnormal findings: Secondary | ICD-10-CM

## 2022-06-10 LAB — TSH RFX ON ABNORMAL TO FREE T4: TSH: 1.1 u[IU]/mL (ref 0.450–4.500)

## 2022-06-10 LAB — CBC WITH DIFFERENTIAL/PLATELET
Basophils Absolute: 0 10*3/uL (ref 0.0–0.2)
Basos: 1 %
EOS (ABSOLUTE): 0.2 10*3/uL (ref 0.0–0.4)
Eos: 3 %
Hematocrit: 46.5 % (ref 34.0–46.6)
Hemoglobin: 15.7 g/dL (ref 11.1–15.9)
Immature Grans (Abs): 0.1 10*3/uL (ref 0.0–0.1)
Immature Granulocytes: 1 %
Lymphocytes Absolute: 2.2 10*3/uL (ref 0.7–3.1)
Lymphs: 29 %
MCH: 32.2 pg (ref 26.6–33.0)
MCHC: 33.8 g/dL (ref 31.5–35.7)
MCV: 95 fL (ref 79–97)
Monocytes Absolute: 0.5 10*3/uL (ref 0.1–0.9)
Monocytes: 7 %
Neutrophils Absolute: 4.6 10*3/uL (ref 1.4–7.0)
Neutrophils: 59 %
Platelets: 232 10*3/uL (ref 150–450)
RBC: 4.88 x10E6/uL (ref 3.77–5.28)
RDW: 13 % (ref 11.7–15.4)
WBC: 7.6 10*3/uL (ref 3.4–10.8)

## 2022-06-10 LAB — LIPID PANEL
Chol/HDL Ratio: 3.3 ratio (ref 0.0–4.4)
Cholesterol, Total: 231 mg/dL — ABNORMAL HIGH (ref 100–199)
HDL: 71 mg/dL (ref >39)
LDL Chol Calc (NIH): 132 mg/dL — ABNORMAL HIGH (ref 0–99)
Triglycerides: 160 mg/dL — ABNORMAL HIGH (ref 0–149)
VLDL Cholesterol Cal: 28 mg/dL (ref 5–40)

## 2022-06-10 LAB — COMPREHENSIVE METABOLIC PANEL
ALT: 38 IU/L — ABNORMAL HIGH (ref 0–32)
AST: 26 IU/L (ref 0–40)
Albumin/Globulin Ratio: 2.3 — ABNORMAL HIGH (ref 1.2–2.2)
Albumin: 4.3 g/dL (ref 3.8–4.8)
Alkaline Phosphatase: 86 IU/L (ref 44–121)
BUN/Creatinine Ratio: 10 (ref 9–23)
BUN: 9 mg/dL (ref 6–24)
Bilirubin Total: <0.2 mg/dL (ref 0.0–1.2)
CO2: 19 mmol/L — ABNORMAL LOW (ref 20–29)
Calcium: 9.1 mg/dL (ref 8.7–10.2)
Chloride: 103 mmol/L (ref 96–106)
Creatinine, Ser: 0.88 mg/dL (ref 0.57–1.00)
Globulin, Total: 1.9 g/dL (ref 1.5–4.5)
Glucose: 105 mg/dL — ABNORMAL HIGH (ref 70–99)
Potassium: 4.1 mmol/L (ref 3.5–5.2)
Sodium: 139 mmol/L (ref 134–144)
Total Protein: 6.2 g/dL (ref 6.0–8.5)
eGFR: 83 mL/min/{1.73_m2} (ref >59)

## 2022-06-10 LAB — HEMOGLOBIN A1C
Est. average glucose Bld gHb Est-mCnc: 100 mg/dL
Hgb A1c MFr Bld: 5.1 % (ref 4.8–5.6)

## 2022-06-17 ENCOUNTER — Ambulatory Visit (INDEPENDENT_AMBULATORY_CARE_PROVIDER_SITE_OTHER): Payer: 59 | Admitting: Family Medicine

## 2022-06-17 ENCOUNTER — Encounter (HOSPITAL_BASED_OUTPATIENT_CLINIC_OR_DEPARTMENT_OTHER): Payer: Self-pay | Admitting: Family Medicine

## 2022-06-17 VITALS — BP 177/109 | HR 109 | Ht 62.0 in | Wt 214.2 lb

## 2022-06-17 DIAGNOSIS — Z0000 Encounter for general adult medical examination without abnormal findings: Secondary | ICD-10-CM | POA: Diagnosis not present

## 2022-06-17 DIAGNOSIS — R251 Tremor, unspecified: Secondary | ICD-10-CM | POA: Diagnosis not present

## 2022-06-17 NOTE — Progress Notes (Signed)
Subjective:    CC: Annual Physical Exam  HPI:  Romy Ipock is a 46 y.o. presenting for annual physical  I reviewed the past medical history, family history, social history, surgical history, and allergies today and no changes were needed.  Please see the problem list section below in epic for further details.  Past Medical History: Past Medical History:  Diagnosis Date   Allergy    Anxiety    Depression    Migraine    Past Surgical History: Past Surgical History:  Procedure Laterality Date   BUNIONECTOMY Right    Right foot   CESAREAN SECTION     TUBAL LIGATION     Social History: Social History   Socioeconomic History   Marital status: Married    Spouse name: Not on file   Number of children: Not on file   Years of education: Not on file   Highest education level: Not on file  Occupational History   Not on file  Tobacco Use   Smoking status: Not on file   Smokeless tobacco: Never  Substance and Sexual Activity   Alcohol use: Yes    Comment: Social drinking   Drug use: No   Sexual activity: Not on file  Other Topics Concern   Not on file  Social History Narrative   ** Merged History Encounter **       Social Determinants of Health   Financial Resource Strain: Not on file  Food Insecurity: Not on file  Transportation Needs: Not on file  Physical Activity: Not on file  Stress: Not on file  Social Connections: Not on file   Family History: Family History  Problem Relation Age of Onset   Heart disease Mother    Hypertension Mother    Hypertension Father    Diabetes Father    Alzheimer's disease Maternal Grandmother    Heart disease Maternal Grandfather    Cancer Paternal Grandmother    Stroke Paternal Grandmother    Hyperlipidemia Paternal Grandfather    Diabetes Paternal Grandfather    Allergies: No Known Allergies Medications: See med rec.  Review of Systems: No headache, visual changes, nausea, vomiting, diarrhea, constipation, dizziness,  abdominal pain, skin rash, fevers, chills, night sweats, swollen lymph nodes, weight loss, chest pain, body aches, joint swelling, muscle aches, shortness of breath, mood changes, visual or auditory hallucinations.  Objective:    BP (!) 177/109   Pulse (!) 109   Ht 5\' 2"  (1.575 m)   Wt 214 lb 3.2 oz (97.2 kg)   SpO2 99%   BMI 39.18 kg/m   General: Well Developed, well nourished, and in no acute distress.  Neuro: Alert and oriented x3, extra-ocular muscles intact, sensation grossly intact. Cranial nerves II through XII are intact, motor, sensory, and coordinative functions are all intact. HEENT: Normocephalic, atraumatic, pupils equal round reactive to light, neck supple, no masses, no lymphadenopathy, thyroid nonpalpable. Oropharynx, nasopharynx, external ear canals are unremarkable. Skin: Warm and dry, no rashes noted.  Cardiac: Regular rate and rhythm, no murmurs rubs or gallops.  Respiratory: Clear to auscultation bilaterally. Not using accessory muscles, speaking in full sentences.  Abdominal: Soft, nontender, nondistended, positive bowel sounds, no masses, no organomegaly.  Musculoskeletal: Shoulder, elbow, wrist, hip, knee, ankle stable, and with full range of motion.  Impression and Recommendations:    Wellness examination Routine HCM labs reviewed. HCM reviewed/discussed. Anticipatory guidance regarding healthy weight, lifestyle and choices given. Recommend healthy diet.  Recommend approximately 150 minutes/week of moderate intensity exercise  Recommend regular dental and vision exams Always use seatbelt/lap and shoulder restraints Recommend using smoke alarms and checking batteries at least twice a year Recommend using sunscreen when outside Discussed tetanus immunization recommendations, patient reports that she is UTD  Patient with reported twitching/tremor of left hand, also with some twitching of lower extremity. She did try to schedule with her neurologist she has seen in  the past, however it has been more than 3 years since her last visit with them and thus they require a referral for patient to reestablish. Referral placed today  BP elevated in office today. Patient is inconsistent with antihypertensive. Discussed ways to improve adherence. Discussed importance of regularly taking medication. Patient will try methods to be more regular with medication, recommend intermittent BP monitoring at home as well Plan for close follow-up to reassess.  Return in about 4 weeks (around 07/15/2022) for HTN, tobacco use.   ___________________________________________ Takao Lizer de [place], MD, ABFM, [facility] Primary Care and Sports Medicine [facility]

## 2022-06-17 NOTE — Patient Instructions (Signed)

## 2022-06-17 NOTE — Assessment & Plan Note (Signed)
Routine HCM labs reviewed. HCM reviewed/discussed. Anticipatory guidance regarding healthy weight, lifestyle and choices given. Recommend healthy diet.  Recommend approximately 150 minutes/week of moderate intensity exercise Recommend regular dental and vision exams Always use seatbelt/lap and shoulder restraints Recommend using smoke alarms and checking batteries at least twice a year Recommend using sunscreen when outside Discussed tetanus immunization recommendations, patient reports that she is UTD

## 2022-07-08 ENCOUNTER — Encounter (HOSPITAL_BASED_OUTPATIENT_CLINIC_OR_DEPARTMENT_OTHER): Payer: Self-pay | Admitting: Family Medicine

## 2022-07-11 ENCOUNTER — Other Ambulatory Visit (HOSPITAL_BASED_OUTPATIENT_CLINIC_OR_DEPARTMENT_OTHER): Payer: Self-pay | Admitting: Family Medicine

## 2022-07-11 ENCOUNTER — Other Ambulatory Visit (HOSPITAL_BASED_OUTPATIENT_CLINIC_OR_DEPARTMENT_OTHER): Payer: Self-pay

## 2022-07-11 ENCOUNTER — Encounter (HOSPITAL_BASED_OUTPATIENT_CLINIC_OR_DEPARTMENT_OTHER): Payer: Self-pay | Admitting: Family Medicine

## 2022-07-11 DIAGNOSIS — K219 Gastro-esophageal reflux disease without esophagitis: Secondary | ICD-10-CM

## 2022-07-11 MED ORDER — OMEPRAZOLE 20 MG PO CPDR: 20.0000 mg | DELAYED_RELEASE_CAPSULE | Freq: Every day | ORAL | 1 refills | Status: DC

## 2022-07-14 MED ORDER — OMEPRAZOLE 20 MG PO CPDR: 20.0000 mg | DELAYED_RELEASE_CAPSULE | Freq: Every day | ORAL | 3 refills | Status: DC

## 2022-07-14 NOTE — Telephone Encounter (Signed)
Updated to 90 day supply and sent to pharmacy on file.

## 2022-07-18 ENCOUNTER — Ambulatory Visit: Payer: Self-pay | Admitting: Diagnostic Neuroimaging

## 2022-07-29 ENCOUNTER — Ambulatory Visit (HOSPITAL_BASED_OUTPATIENT_CLINIC_OR_DEPARTMENT_OTHER): Payer: 59 | Admitting: Family Medicine

## 2022-07-30 ENCOUNTER — Ambulatory Visit (INDEPENDENT_AMBULATORY_CARE_PROVIDER_SITE_OTHER): Payer: 59 | Admitting: Radiology

## 2022-07-30 ENCOUNTER — Other Ambulatory Visit: Payer: Self-pay | Admitting: Radiology

## 2022-07-30 ENCOUNTER — Telehealth: Payer: Self-pay

## 2022-07-30 ENCOUNTER — Encounter: Payer: Self-pay | Admitting: Radiology

## 2022-07-30 ENCOUNTER — Other Ambulatory Visit (HOSPITAL_COMMUNITY)
Admission: RE | Admit: 2022-07-30 | Discharge: 2022-07-30 | Disposition: A | Discharge status: Home / Self Care | Payer: 59 | Source: Ambulatory Visit | Attending: Radiology | Admitting: Radiology

## 2022-07-30 VITALS — BP 124/88 | Ht 62.0 in | Wt 210.0 lb

## 2022-07-30 DIAGNOSIS — Z8742 Personal history of other diseases of the female genital tract: Secondary | ICD-10-CM | POA: Diagnosis present

## 2022-07-30 DIAGNOSIS — Z1231 Encounter for screening mammogram for malignant neoplasm of breast: Secondary | ICD-10-CM

## 2022-07-30 DIAGNOSIS — Z86010 Personal history of colonic polyps: Secondary | ICD-10-CM

## 2022-07-30 DIAGNOSIS — Z01419 Encounter for gynecological examination (general) (routine) without abnormal findings: Secondary | ICD-10-CM | POA: Diagnosis not present

## 2022-07-30 NOTE — Telephone Encounter (Signed)
-----   Message from Tanda Rockers, NP sent at 07/30/2022  8:30 AM EDT ----- Please refer for [facility] for colonoscopy, hx of polyps and diverticula. Was due in 2021.

## 2022-07-30 NOTE — Telephone Encounter (Signed)
Referral faxed successfully.  

## 2022-07-30 NOTE — Progress Notes (Signed)
   Courtney Powers 11-12-1976 [id number]   History:  46 y.o. G3P3 presents for annual exam. Hx of abnormal paps, due for pap today. No gyn concerns. 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  Gynecologic History Patient's last menstrual period was 07/17/2022 (exact date). Period Cycle (Days): 28 Period Duration (Days): 5 Period Pattern: Regular Menstrual Flow: Moderate Menstrual Control: Tampon Dysmenorrhea: (!) Mild Dysmenorrhea Symptoms: Cramping Contraception/Family planning: tubal ligation Sexually active: yes Last Pap: 2022. Results were: abnormal Last mammogram: 2022. Results were: normal  Obstetric History OB History  Gravida Para Term Preterm AB Living  3 3       3   SAB IAB Ectopic Multiple Live Births          3    # Outcome Date GA Lbr Len/2nd Weight Sex Delivery Anes PTL Lv  3 Para           2 Para           1 Para              The following portions of the patient's history were reviewed and updated as appropriate: allergies, current medications, past family history, past medical history, past social history, past surgical history, and problem list.  Review of Systems Pertinent items noted in HPI and remainder of comprehensive ROS otherwise negative.   Past medical history, past surgical history, family history and social history were all reviewed and documented in the EPIC chart.   Exam:  Vitals:   07/30/22 0750  BP: 124/88  Weight: 210 lb (95.3 kg)  Height: 5\' 2"  (1.575 m)   Body mass index is 38.41 kg/m.  General appearance:  Normal Thyroid:  Symmetrical, normal in size, without palpable masses or nodularity. Respiratory  Auscultation:  Clear without wheezing or rhonchi Cardiovascular  Auscultation:  Regular rate, without rubs, murmurs or gallops  Edema/varicosities:  Not grossly evident Abdominal  Soft,nontender, without masses, guarding or rebound.  Liver/spleen:  No organomegaly noted  Hernia:  None appreciated  Skin  Inspection:  Grossly normal Breasts:  Examined lying and sitting.   Right: Without masses, retractions, nipple discharge or axillary adenopathy.   Left: Without masses, retractions, nipple discharge or axillary adenopathy. Genitourinary   Inguinal/mons:  Normal without inguinal adenopathy  External genitalia:  Normal appearing vulva with no masses, tenderness, or lesions  BUS/Urethra/Skene's glands:  Normal without masses or exudate  Vagina:  Normal appearing with normal color and discharge, no lesions  Cervix:  Normal appearing without discharge or lesions  Uterus:  Normal in size, shape and contour.  Mobile, nontender  Adnexa/parametria:     Rt: Normal in size, without masses or tenderness.   Lt: Normal in size, without masses or tenderness.  Anus and perineum: Normal   Patient informed chaperone available to be present for breast and pelvic exam. Patient has requested no chaperone to be present. Patient has been advised what will be completed during breast and pelvic exam.   Assessment/Plan:   1. Well woman exam with routine gynecological exam Pap with cotesting Schedule Mammo Colonoscopy was due 2021- hx of polyps and diverticula  2. History of abnormal cervical Pap smear  - Cytology - PAP( [place])    Discussed SBE, mammo, colonoscopy and DEXA screening as directed/appropriate. Recommend of exercise weekly, including weight bearing exercise. Encouraged the use of seatbelts and sunscreen. Return in 1 year for annual or as needed.   2022 B WHNP-BC 8:21 AM 07/30/2022

## 2022-08-04 LAB — CYTOLOGY - PAP
Adequacy: ABSENT
Comment: NEGATIVE
Diagnosis: NEGATIVE
Diagnosis: REACTIVE
High risk HPV: NEGATIVE

## 2022-08-13 NOTE — Telephone Encounter (Signed)
Ok to send, thanks

## 2022-08-13 NOTE — Telephone Encounter (Signed)
FYISherron Monday w/ Lynnell Catalan @ [facility] and she states that they are about 3 months behind on calls for pt's and getting them on the books for screening colonoscopies.   Pt made aware and offered to send referral for her to another location just made her aware that the other location would probably want the previous records from old office/procedure. Pt voiced understanding.   Pt stated that she was referred by friend/relative to [facility], Dr. Luvenia Starch office. She would like her referral sent there if possible. Please advise, thanks.

## 2022-08-13 NOTE — Telephone Encounter (Signed)
Left detailed VM on machine per DPR to inform pt that new referral had been sent.

## 2022-08-14 ENCOUNTER — Encounter: Payer: Self-pay | Admitting: *Deleted

## 2022-08-18 ENCOUNTER — Ambulatory Visit: Payer: 59 | Admitting: Diagnostic Neuroimaging

## 2022-08-18 ENCOUNTER — Encounter: Payer: Self-pay | Admitting: Diagnostic Neuroimaging

## 2022-08-18 VITALS — BP 146/98 | HR 96 | Ht 62.0 in | Wt 214.2 lb

## 2022-08-18 DIAGNOSIS — G253 Myoclonus: Secondary | ICD-10-CM | POA: Diagnosis not present

## 2022-08-18 DIAGNOSIS — R251 Tremor, unspecified: Secondary | ICD-10-CM | POA: Diagnosis not present

## 2022-08-18 DIAGNOSIS — R200 Anesthesia of skin: Secondary | ICD-10-CM | POA: Diagnosis not present

## 2022-08-18 NOTE — Progress Notes (Signed)
[facility]  PATIENT: Courtney Powers DOB: [birth year]  REFERRING CLINICIAN: [place], Blondell Reveal, MD HISTORY FROM: patient  REASON FOR VISIT: new consult    HISTORICAL  CHIEF COMPLAINT:  Chief Complaint  Patient presents with   Hand tremors    Rm 7 New Pt "left hand tremors x 6 months    HISTORY OF PRESENT ILLNESS:   46 year old female here for evaluation of tremors and muscle twitching.  Symptoms started around 6 months ago.  Initially patient had intermittent symptoms in her left arm and hand with muscle twitching, jerking and tremors, sometimes numbness.  Also having some symptoms in left leg as well.  Also has intermittent numbness in bilateral upper and lower extremities.  Has been noting some increasing urinary urgency and incontinence with coughing.  Has had some intermittent joint pain and ANA abnormalities in the past, saw a rheumatology but no specific diagnosis was found.  Previously seen around 10 years ago by Dr. Jannifer Franklin for migraine management.  Doing well on Imitrex.   REVIEW OF SYSTEMS: Full 14 system review of systems performed and negative with exception of: as per HPI.  ALLERGIES: No Known Allergies  HOME MEDICATIONS: Outpatient Medications Prior to Visit  Medication Sig Dispense Refill   FLUoxetine (PROZAC) 20 MG capsule Take 20 mg by mouth daily.     lisinopril (ZESTRIL) 10 MG tablet Take 1 tablet by mouth daily.     Multiple Vitamin (MULTIVITAMIN) tablet Take 1 tablet by mouth daily.     Omega-3 Fatty Acids (FISH OIL) 1200 MG CAPS Take by mouth.     omeprazole (PRILOSEC) 20 MG capsule Take 1 capsule (20 mg total) by mouth daily. 90 capsule 3   SUMAtriptan (IMITREX) 100 MG tablet Take 100 mg by mouth every 2 (two) hours as needed for migraine. May repeat in 2 hours if headache persists or recurs.     valACYclovir (VALTREX) 500 MG tablet Take 1 tablet every day by oral route.     No facility-administered medications prior to visit.     PAST MEDICAL HISTORY: Past Medical History:  Diagnosis Date   Allergy    Anxiety    Depression    Migraine     PAST SURGICAL HISTORY: Past Surgical History:  Procedure Laterality Date   BUNIONECTOMY Right    Right foot   CESAREAN SECTION     TUBAL LIGATION      FAMILY HISTORY: Family History  Problem Relation Age of Onset   Heart disease Mother    Hypertension Mother    Hypertension Father    Diabetes Father    Alzheimer's disease Maternal Grandmother    Heart disease Maternal Grandfather    Cancer Paternal Grandmother    Stroke Paternal Grandmother    Hyperlipidemia Paternal Grandfather    Diabetes Paternal Grandfather     SOCIAL HISTORY: Social History   Socioeconomic History   Marital status: Married    Spouse name: Not on file   Number of children: 3   Years of education: Not on file   Highest education level: High school graduate  Occupational History   Not on file  Tobacco Use   Smoking status: Some Days    Types: Cigarettes    Last attempt to quit: 08/21/2013    Years since quitting: 8.9   Smokeless tobacco: Never   Tobacco comments:    08/18/22 avge 10 cigs weekly  Substance and Sexual Activity   Alcohol use: Not Currently    Comment:  Social drinking   Drug use: No   Sexual activity: Yes    Partners: Male    Birth control/protection: Surgical    Comment: tubal  Other Topics Concern   Not on file  Social History Narrative   Lives with husband   Social Determinants of Health   Financial Resource Strain: Not on file  Food Insecurity: Not on file  Transportation Needs: Not on file  Physical Activity: Not on file  Stress: Not on file  Social Connections: Not on file  Intimate Partner Violence: Not on file     PHYSICAL EXAM  GENERAL EXAM/CONSTITUTIONAL: Vitals:  Vitals:   08/18/22 0847  BP: (!) 146/98  Pulse: 96  Weight: 214 lb 3.2 oz (97.2 kg)  Height: _0  (1.575 m)   Body mass index is 39.18 kg/m. Wt Readings from  Last 3 Encounters:  08/18/22 214 lb 3.2 oz (97.2 kg)  07/30/22 210 lb (95.3 kg)  06/17/22 214 lb 3.2 oz (97.2 kg)   Patient is in no distress; well developed, nourished and groomed; neck is supple  CARDIOVASCULAR: Examination of carotid arteries is normal; no carotid bruits Regular rate and rhythm, no murmurs Examination of peripheral vascular system by observation and palpation is normal  EYES: Ophthalmoscopic exam of optic discs and posterior segments is normal; no papilledema or hemorrhages No results found.  MUSCULOSKELETAL: Gait, strength, tone, movements noted in Neurologic exam below  NEUROLOGIC: MENTAL STATUS:      No data to display         awake, alert, oriented to person, place and time recent and remote memory intact normal attention and concentration language fluent, comprehension intact, naming intact fund of knowledge appropriate  CRANIAL NERVE:  2nd - no papilledema on fundoscopic exam 2nd, 3rd, 4th, 6th - pupils equal and reactive to light, visual fields full to confrontation, extraocular muscles intact, no nystagmus 5th - facial sensation symmetric 7th - facial strength symmetric 8th - hearing intact 9th - palate elevates symmetrically, uvula midline 11th - shoulder shrug symmetric 12th - tongue protrusion midline  MOTOR:  normal bulk and tone, full strength in the BUE, BLE; SUBTLE INCREASED TONE IN LUE  SENSORY:  normal and symmetric to light touch, temperature, vibration  COORDINATION:  finger-nose-finger, fine finger movements normal; MILD INTENTION TREMOR IN LUE  REFLEXES:  deep tendon reflexes present and symmetric; EXCEPT SLIGHTLY BRISK IN LEFT KNEE; NO CLONUS; NO HOFFMANS  GAIT/STATION:  narrow based gait;     DIAGNOSTIC DATA (LABS, IMAGING, TESTING) - I reviewed patient records, labs, notes, testing and imaging myself where available.  Lab Results  Component Value Date   WBC 7.6 06/09/2022   HGB 15.7 06/09/2022   HCT 46.5  06/09/2022   MCV 95 06/09/2022   PLT 232 06/09/2022      Component Value Date/Time   NA 139 06/09/2022 0922   K 4.1 06/09/2022 0922   CL 103 06/09/2022 0922   CO2 19 (L) 06/09/2022 0922   GLUCOSE 105 (H) 06/09/2022 0922   GLUCOSE 77 05/03/2010 1135   BUN 9 06/09/2022 0922   CREATININE 0.88 06/09/2022 0922   CALCIUM 9.1 06/09/2022 0922   PROT 6.2 06/09/2022 0922   ALBUMIN 4.3 06/09/2022 0922   AST 26 06/09/2022 0922   ALT 38 (H) 06/09/2022 0922   ALKPHOS 86 06/09/2022 0922   BILITOT <0.2 06/09/2022 0922   GFRNONAA >60 05/03/2010 1135   GFRAA  05/03/2010 1135    >60  The eGFR has been calculated using the MDRD equation. This calculation has not been validated in all clinical situations. eGFR's persistently <60 mL/min signify possible Chronic Kidney Disease.   Lab Results  Component Value Date   CHOL 231 (H) 06/09/2022   HDL 71 06/09/2022   LDLCALC 132 (H) 06/09/2022   TRIG 160 (H) 06/09/2022   CHOLHDL 3.3 06/09/2022   Lab Results  Component Value Date   HGBA1C 5.1 06/09/2022   No results found for: "VITAMINB12" Lab Results  Component Value Date   TSH 1.100 06/09/2022    01/06/19 MRI lumbar spine 1. Transitional lumbosacral vertebra numbered L5. Spinal radiography could alter spinal numbering. 2. L3-4 and L4-5 facet degeneration, greater at L3-4 where there is slight anterolisthesis. 3. L4-5 disc degeneration. 4. No impingement throughout the lumbar spine.   ASSESSMENT AND PLAN  46 y.o. year old female here with:   Dx:  1. Tremor   2. Numbness   3. Myoclonus       PLAN:  TREMOR, MYOCLONUS (LEFT ARM AND LEFT LEG), NUMBNESS IN BUE AND BLE (since early 2023) - check MRI brain, cervical spine (rule out demyelinating dz; slightly brisk reflex in left knee) - check B12 level  Orders Placed This Encounter  Procedures   MR BRAIN W WO CONTRAST   MR CERVICAL SPINE W WO CONTRAST   Vitamin B12   Return for pending test results, pending if  symptoms worsen or fail to improve.    Penni Bombard, MD 7/59/1638, 4:66 AM Certified in Neurology, Neurophysiology and Neuroimaging  [facility] [address], [place], [place] [address] [phone]

## 2022-08-18 NOTE — Patient Instructions (Signed)
-   check MRI scans and b12 level

## 2022-08-19 LAB — VITAMIN B12: Vitamin B-12: 426 pg/mL (ref 232–1245)

## 2022-08-25 ENCOUNTER — Encounter: Payer: Self-pay | Admitting: Diagnostic Neuroimaging

## 2022-08-27 ENCOUNTER — Encounter (HOSPITAL_BASED_OUTPATIENT_CLINIC_OR_DEPARTMENT_OTHER): Payer: Self-pay | Admitting: Family Medicine

## 2022-08-27 ENCOUNTER — Telehealth: Payer: Self-pay | Admitting: Diagnostic Neuroimaging

## 2022-08-27 NOTE — Telephone Encounter (Signed)
Pt scheduled for MRI brain w/wo contrast & MRI cervical spine w/wo contrast on 09/02/22 at 3:00 pm.  [employer]: [id number] (08/27/22-10/11/22)

## 2022-08-28 ENCOUNTER — Encounter: Payer: Self-pay | Admitting: *Deleted

## 2022-08-28 MED ORDER — SUMATRIPTAN SUCCINATE 100 MG PO TABS: 100.0000 mg | ORAL_TABLET | ORAL | 1 refills | Status: DC | PRN

## 2022-08-28 NOTE — Patient Instructions (Signed)
  Procedure: Colonoscopy  Estimated body mass index is 38.41 kg/m as calculated from the following:   Height as of this encounter: 5\' 2"  (1.575 m).   Weight as of this encounter: 210 lb (95.3 kg).   Have you had a colonoscopy before?  2011  Do you have family history of colon cancer  no  Do you have a family history of polyps? yes  Previous colonoscopy with polyps removed? Yes at very young age, unsure when  Do you have a history colorectal cancer?   no  Are you diabetic?  no  Do you have a prosthetic or mechanical heart valve? no  Do you have a pacemaker/defibrillator?   no  Have you had endocarditis/atrial fibrillation?  no  Do you use supplemental oxygen/CPAP?  no  Have you had joint replacement within the last 12 months?  no  Do you tend to be constipated or have to use laxatives?  no   Do you have history of alcohol use? If yes, how much and how often.  Usually 10 drinks a weekend  Do you have history or are you using drugs? If yes, what do are you  using?  no  Have you ever had a stroke/heart attack?  no  Have you ever had a heart or other vascular stent placed,?no  Do you take weight loss medication? no  female patients,: have you had a hysterectomy? no                              are you post menopausal?  no                              do you still have your menstrual cycle? yes    Date of last menstrual period. 08/13/22  Do you take any blood-thinning medications such as: (Plavix, aspirin, Coumadin, Aggrenox, Brilinta, Xarelto, Eliquis, Pradaxa, Savaysa or Effient) no  If yes we need the name, milligram, dosage and who is prescribing doctor:               Current Outpatient Medications  Medication Sig Dispense Refill   FLUoxetine (PROZAC) 20 MG capsule Take 20 mg by mouth daily.     lisinopril (ZESTRIL) 10 MG tablet Take 1 tablet by mouth daily.     Multiple Vitamin (MULTIVITAMIN) tablet Take 1 tablet by mouth daily.     Omega-3 Fatty Acids (FISH OIL)  1200 MG CAPS Take by mouth.     omeprazole (PRILOSEC) 20 MG capsule Take 1 capsule (20 mg total) by mouth daily. 90 capsule 3   SUMAtriptan (IMITREX) 100 MG tablet Take 100 mg by mouth every 2 (two) hours as needed for migraine. May repeat in 2 hours if headache persists or recurs.     valACYclovir (VALTREX) 500 MG tablet Take 1 tablet every day by oral route.     No current facility-administered medications for this visit.    No Known Allergies

## 2022-09-02 ENCOUNTER — Ambulatory Visit: Payer: 59

## 2022-09-02 DIAGNOSIS — G253 Myoclonus: Secondary | ICD-10-CM | POA: Diagnosis not present

## 2022-09-02 DIAGNOSIS — R200 Anesthesia of skin: Secondary | ICD-10-CM

## 2022-09-02 DIAGNOSIS — R251 Tremor, unspecified: Secondary | ICD-10-CM

## 2022-09-02 MED ORDER — GADOBENATE DIMEGLUMINE 529 MG/ML IV SOLN
20.0000 mL | Freq: Once | INTRAVENOUS | Status: AC | PRN
  Administered 2022-09-02: 20 mL via INTRAVENOUS

## 2022-09-04 ENCOUNTER — Encounter: Payer: Self-pay | Admitting: Diagnostic Neuroimaging

## 2022-09-04 NOTE — Telephone Encounter (Signed)
Left detailed VM on machine per DPR re: following up on GI referral.

## 2022-09-05 NOTE — Progress Notes (Addendum)
Ok to schedule. ASA 2.  Needs pregnancy test.

## 2022-09-09 NOTE — Progress Notes (Signed)
LMVOM to call back 

## 2022-09-10 ENCOUNTER — Other Ambulatory Visit: Payer: Self-pay | Admitting: Radiology

## 2022-09-10 DIAGNOSIS — Z01419 Encounter for gynecological examination (general) (routine) without abnormal findings: Secondary | ICD-10-CM

## 2022-09-10 MED ORDER — FLUOXETINE HCL 20 MG PO CAPS: 20.0000 mg | ORAL_CAPSULE | Freq: Every day | ORAL | 4 refills | Status: DC

## 2022-09-10 NOTE — Telephone Encounter (Signed)
Patient had annual exam in 07/2022

## 2022-09-12 ENCOUNTER — Ambulatory Visit (HOSPITAL_BASED_OUTPATIENT_CLINIC_OR_DEPARTMENT_OTHER): Payer: 59 | Admitting: Family Medicine

## 2022-09-12 ENCOUNTER — Encounter: Payer: Self-pay | Admitting: *Deleted

## 2022-09-12 NOTE — Progress Notes (Signed)
LMOVM to call back.  Will mail letter

## 2022-09-15 DIAGNOSIS — R200 Anesthesia of skin: Secondary | ICD-10-CM

## 2022-10-01 ENCOUNTER — Encounter: Payer: Self-pay | Admitting: Diagnostic Neuroimaging

## 2022-10-01 NOTE — Telephone Encounter (Signed)
Pt is asking for a referral to be sent for Neurosurgery. Please advise.

## 2022-10-01 NOTE — Telephone Encounter (Signed)
Addressed in [place] message 09/25/22.

## 2022-10-02 NOTE — Telephone Encounter (Signed)
FYI. Pt prefers to wait until next year to schedule next colonoscopy. Ok to close referral encounter?

## 2022-10-02 NOTE — Telephone Encounter (Signed)
Referral sent to [facility], phone # [phone]. 

## 2022-10-02 NOTE — Telephone Encounter (Signed)
Ok to close

## 2022-10-02 NOTE — Telephone Encounter (Signed)
Orders Placed This Encounter  Procedures   Ambulatory referral to Neurosurgery    Penni Bombard, MD 50/08/3266, 1:24 AM Certified in Neurology, Neurophysiology and Neuroimaging  [facility] [address] [place], [place] [address] [phone]

## 2022-10-07 ENCOUNTER — Ambulatory Visit (HOSPITAL_BASED_OUTPATIENT_CLINIC_OR_DEPARTMENT_OTHER): Payer: 59 | Admitting: Family Medicine

## 2022-10-28 ENCOUNTER — Ambulatory Visit (HOSPITAL_BASED_OUTPATIENT_CLINIC_OR_DEPARTMENT_OTHER): Payer: 59 | Admitting: Family Medicine

## 2022-10-31 ENCOUNTER — Ambulatory Visit
Admission: RE | Admit: 2022-10-31 | Discharge: 2022-10-31 | Disposition: A | Discharge status: Home / Self Care | Payer: 59 | Source: Ambulatory Visit

## 2022-10-31 DIAGNOSIS — Z1231 Encounter for screening mammogram for malignant neoplasm of breast: Secondary | ICD-10-CM

## 2022-12-05 ENCOUNTER — Encounter (HOSPITAL_BASED_OUTPATIENT_CLINIC_OR_DEPARTMENT_OTHER): Payer: Self-pay | Admitting: Family Medicine

## 2022-12-09 ENCOUNTER — Other Ambulatory Visit (HOSPITAL_BASED_OUTPATIENT_CLINIC_OR_DEPARTMENT_OTHER): Payer: Self-pay

## 2022-12-09 DIAGNOSIS — M059 Rheumatoid arthritis with rheumatoid factor, unspecified: Secondary | ICD-10-CM

## 2022-12-26 ENCOUNTER — Other Ambulatory Visit (HOSPITAL_BASED_OUTPATIENT_CLINIC_OR_DEPARTMENT_OTHER): Payer: Self-pay | Admitting: Family Medicine

## 2023-01-20 ENCOUNTER — Other Ambulatory Visit (HOSPITAL_BASED_OUTPATIENT_CLINIC_OR_DEPARTMENT_OTHER): Payer: Self-pay

## 2023-01-20 ENCOUNTER — Encounter (HOSPITAL_BASED_OUTPATIENT_CLINIC_OR_DEPARTMENT_OTHER): Payer: Self-pay | Admitting: Family Medicine

## 2023-01-20 DIAGNOSIS — I10 Essential (primary) hypertension: Secondary | ICD-10-CM

## 2023-01-20 MED ORDER — LISINOPRIL 10 MG PO TABS: 10.0000 mg | ORAL_TABLET | Freq: Every day | ORAL | 3 refills | Status: DC

## 2023-03-03 ENCOUNTER — Other Ambulatory Visit (HOSPITAL_BASED_OUTPATIENT_CLINIC_OR_DEPARTMENT_OTHER): Payer: Self-pay

## 2023-03-03 DIAGNOSIS — R519 Headache, unspecified: Secondary | ICD-10-CM

## 2023-03-03 MED ORDER — SUMATRIPTAN SUCCINATE 100 MG PO TABS: ORAL_TABLET | ORAL | 3 refills | Status: AC

## 2023-04-30 ENCOUNTER — Encounter (HOSPITAL_BASED_OUTPATIENT_CLINIC_OR_DEPARTMENT_OTHER): Payer: Self-pay

## 2023-09-30 ENCOUNTER — Encounter (HOSPITAL_BASED_OUTPATIENT_CLINIC_OR_DEPARTMENT_OTHER): Payer: 59 | Admitting: Family Medicine

## 2023-10-12 ENCOUNTER — Other Ambulatory Visit (HOSPITAL_BASED_OUTPATIENT_CLINIC_OR_DEPARTMENT_OTHER): Payer: Self-pay | Admitting: Nurse Practitioner

## 2023-10-12 DIAGNOSIS — K219 Gastro-esophageal reflux disease without esophagitis: Secondary | ICD-10-CM

## 2023-10-13 ENCOUNTER — Encounter (HOSPITAL_BASED_OUTPATIENT_CLINIC_OR_DEPARTMENT_OTHER): Payer: 59 | Admitting: Family Medicine

## 2023-10-20 ENCOUNTER — Encounter (HOSPITAL_BASED_OUTPATIENT_CLINIC_OR_DEPARTMENT_OTHER): Payer: Self-pay | Admitting: Family Medicine

## 2023-10-27 ENCOUNTER — Other Ambulatory Visit (HOSPITAL_BASED_OUTPATIENT_CLINIC_OR_DEPARTMENT_OTHER): Payer: Self-pay | Admitting: *Deleted

## 2023-10-27 DIAGNOSIS — I10 Essential (primary) hypertension: Secondary | ICD-10-CM

## 2023-10-27 MED ORDER — LISINOPRIL 10 MG PO TABS: 10.0000 mg | ORAL_TABLET | Freq: Every day | ORAL | 3 refills | Status: DC

## 2023-11-18 ENCOUNTER — Other Ambulatory Visit (HOSPITAL_BASED_OUTPATIENT_CLINIC_OR_DEPARTMENT_OTHER): Payer: Self-pay | Admitting: Family Medicine

## 2023-11-19 NOTE — Telephone Encounter (Signed)
LVM for pt to call the office.

## 2023-12-01 ENCOUNTER — Other Ambulatory Visit: Payer: Self-pay | Admitting: Obstetrics and Gynecology

## 2023-12-01 DIAGNOSIS — Z1231 Encounter for screening mammogram for malignant neoplasm of breast: Secondary | ICD-10-CM

## 2024-01-01 DIAGNOSIS — Z1231 Encounter for screening mammogram for malignant neoplasm of breast: Secondary | ICD-10-CM

## 2024-01-15 ENCOUNTER — Ambulatory Visit
Admission: RE | Admit: 2024-01-15 | Discharge: 2024-01-15 | Disposition: A | Discharge status: Home / Self Care | Payer: 59 | Source: Ambulatory Visit | Attending: Obstetrics and Gynecology | Admitting: Obstetrics and Gynecology

## 2024-01-15 DIAGNOSIS — Z1231 Encounter for screening mammogram for malignant neoplasm of breast: Secondary | ICD-10-CM

## 2024-02-09 ENCOUNTER — Ambulatory Visit (AMBULATORY_SURGERY_CENTER): Payer: 59 | Admitting: *Deleted

## 2024-02-09 VITALS — Ht 62.5 in | Wt 213.0 lb

## 2024-02-09 DIAGNOSIS — Z1211 Encounter for screening for malignant neoplasm of colon: Secondary | ICD-10-CM

## 2024-02-09 DIAGNOSIS — Z860100 Personal history of colon polyps, unspecified: Secondary | ICD-10-CM

## 2024-02-09 MED ORDER — SUFLAVE 178.7 G PO SOLR: 1.0000 | Freq: Once | ORAL | 0 refills | Status: AC

## 2024-02-09 NOTE — Progress Notes (Signed)
Pt's name and DOB verified at the beginning of the pre-visit wit 2 identifiers  Permission given to speak with  Pt denies any difficulty with ambulating,sitting, laying down or rolling side to side  Pt has no issues with ambulation   Pt has no issues moving head neck or swallowing  No egg or soy allergy known to patient   No issues known to pt with past sedation with any surgeries or procedures  Pt denies having issues being intubated  No FH of Malignant Hyperthermia  Pt is not on diet pills or shots  Pt is not on home 02   Pt is not on blood thinners   Pt denies issues with constipation   Pt is not on dialysis  Pt denise any abnormal heart rhythms   Pt denies any upcoming cardiac testing  Patient's chart reviewed by Cathlyn Parsons CNRA prior to pre-visit and patient appropriate for the [facility].  Pre-visit completed and red dot placed by patient's name on their procedure day (on provider's schedule).    Visit by phone  Pt states weight is 213 lb  IInstructions reviewed. Pt given [facility], [facility] main # and MD on call # prior to instructions.  Pt states understanding of instructions. Instructed to review again prior to procedure. Pt states they will.   Informed pt that they will receive a text or  call from [facility] regarding there prep med.

## 2024-02-25 ENCOUNTER — Encounter: Payer: Self-pay | Admitting: Internal Medicine

## 2024-02-27 HISTORY — PX: COLONOSCOPY: SHX174

## 2024-03-04 ENCOUNTER — Encounter: Payer: Self-pay | Admitting: Internal Medicine

## 2024-03-06 ENCOUNTER — Other Ambulatory Visit (HOSPITAL_BASED_OUTPATIENT_CLINIC_OR_DEPARTMENT_OTHER): Payer: Self-pay | Admitting: Family Medicine

## 2024-03-06 DIAGNOSIS — K219 Gastro-esophageal reflux disease without esophagitis: Secondary | ICD-10-CM

## 2024-04-06 ENCOUNTER — Telehealth (HOSPITAL_BASED_OUTPATIENT_CLINIC_OR_DEPARTMENT_OTHER): Payer: Self-pay | Admitting: *Deleted

## 2024-04-06 NOTE — Telephone Encounter (Signed)
 noted

## 2024-04-06 NOTE — Telephone Encounter (Signed)
 Copied from CRM [id number]. Topic: General - Other >> Apr 06, 2024  2:41 PM Geroge Baseman wrote: Reason for CRM: Cancelled  per [employer], they stated they no longer need the request for medical records that was received by fax on 4/8. Please disregard,.

## 2024-04-19 ENCOUNTER — Encounter: Payer: Self-pay | Admitting: Internal Medicine

## 2024-04-21 NOTE — Progress Notes (Unsigned)
 [facility] History and Physical   Primary Care Physician:  [place], Alonza Jansky, MD   Reason for Procedure:   CRCA screening  Plan:    colonoscopy     HPI: Courtney Powers is a 48 y.o. female here for screening colonoscopy   Past Medical History:  Diagnosis Date   Allergy    Anxiety    Depression    Hyperlipidemia    Hypertension    Migraine     Past Surgical History:  Procedure Laterality Date   BUNIONECTOMY Right    Right foot   CESAREAN SECTION     TUBAL LIGATION      Prior to Admission medications   Medication Sig Start Date End Date Taking? Authorizing Provider  clobetasol (TEMOVATE) 0.05 % external solution  10/28/23   [provider]  cyclobenzaprine  (FLEXERIL ) 5 MG tablet Take 5 mg by mouth 2 (two) times daily. 10/09/23   [provider]  FLUoxetine  (PROZAC ) 20 MG capsule Take 1 capsule (20 mg total) by mouth daily. 09/10/22   Chrzanowski, Jami B, NP  lisinopril  (ZESTRIL ) 10 MG tablet Take 1 tablet (10 mg total) by mouth daily. 10/27/23   de [place], Raymond J, MD  metroNIDAZOLE (METROGEL) 0.75 % gel Apply topically. 01/13/24   [provider]  Multiple Vitamin (MULTIVITAMIN) tablet Take 1 tablet by mouth daily. 09/05/11   [provider]  Omega-3 Fatty Acids (FISH OIL) 1200 MG CAPS Take by mouth.    [provider]  omeprazole  (PRILOSEC) 20 MG capsule TAKE 1 CAPSULE(20 MG) BY MOUTH DAILY 10/14/23   [place], Alonza Jansky, MD  solifenacin (VESICARE) 5 MG tablet Take 5 mg by mouth daily. 12/15/23   [provider]  SUMAtriptan  (IMITREX ) 100 MG tablet TAKE 1 TABLET BY MOUTH AS NEEDED FOR MIGRAINE. MAY REPEAT IN 2 HOURS IF HEADACHE PERSISTS OR RECURS. DO NOT EXCEED 2 DOSES IN 24 HOURS 03/03/23   [place], Alonza Jansky, MD  valACYclovir (VALTREX) 500 MG tablet Take 1 tablet every day by oral route. 06/19/21   [provider]  WELLBUTRIN XL 300 MG 24 hr tablet  01/18/24   [provider]    Current  Outpatient Medications  Medication Sig Dispense Refill   clobetasol (TEMOVATE) 0.05 % external solution      lisinopril  (ZESTRIL ) 10 MG tablet Take 1 tablet (10 mg total) by mouth daily. 90 tablet 3   metroNIDAZOLE (METROGEL) 0.75 % gel Apply topically.     omeprazole  (PRILOSEC) 20 MG capsule TAKE 1 CAPSULE(20 MG) BY MOUTH DAILY 90 capsule 1   SUMAtriptan  (IMITREX ) 100 MG tablet TAKE 1 TABLET BY MOUTH AS NEEDED FOR MIGRAINE. MAY REPEAT IN 2 HOURS IF HEADACHE PERSISTS OR RECURS. DO NOT EXCEED 2 DOSES IN 24 HOURS 10 tablet 3   cyclobenzaprine  (FLEXERIL ) 5 MG tablet Take 5 mg by mouth 2 (two) times daily.     Multiple Vitamin (MULTIVITAMIN) tablet Take 1 tablet by mouth daily.     Omega-3 Fatty Acids (FISH OIL) 1200 MG CAPS Take by mouth.     valACYclovir (VALTREX) 500 MG tablet Take 1 tablet every day by oral route.     Current Facility-Administered Medications  Medication Dose Route Frequency Provider Last Rate Last Admin   0.9 %  sodium chloride  infusion  500 mL Intravenous Once Kenney Peacemaker, MD        Allergies as of 04/22/2024   (No Known Allergies)    Family History  Problem Relation Age  of Onset   Heart disease Mother    Hypertension Mother    Hypertension Father    Diabetes Father    Breast cancer Paternal Aunt 61 - 16   Alzheimer's disease Maternal Grandmother    Heart disease Maternal Grandfather    Cancer Paternal Grandmother    Stroke Paternal Grandmother    Hyperlipidemia Paternal Grandfather    Diabetes Paternal Grandfather    BRCA 1/2 Neg Hx    Colon polyps Neg Hx    Colon cancer Neg Hx    Esophageal cancer Neg Hx    Rectal cancer Neg Hx    Stomach cancer Neg Hx     Social History   Socioeconomic History   Marital status: Married    Spouse name: Not on file   Number of children: 3   Years of education: Not on file   Highest education level: High school graduate  Occupational History   Not on file  Tobacco Use   Smoking status: Some Days    Current  packs/day: 0.00    Types: Cigarettes    Last attempt to quit: 08/21/2013    Years since quitting: 10.6   Smokeless tobacco: Never   Tobacco comments:    08/18/22 avge 10 cigs weekly  Vaping Use   Vaping status: Never Used  Substance and Sexual Activity   Alcohol use: Not Currently    Comment: Social drinking   Drug use: No   Sexual activity: Yes    Partners: Male    Birth control/protection: Surgical    Comment: tubal  Other Topics Concern   Not on file  Social History Narrative   Lives with husband   Social Drivers of Corporate investment banker Strain: Not on file  Food Insecurity: Not on file  Transportation Needs: Not on file  Physical Activity: Not on file  Stress: Not on file  Social Connections: Not on file  Intimate Partner Violence: Not on file    Review of Systems:  All other review of systems negative except as mentioned in the HPI.  Physical Exam: Vital signs BP (!) 156/101   Pulse 83   Temp 97.9 F (36.6 C) (Temporal)   Resp 10   Ht 5' 2.5" (1.588 m)   Wt 213 lb (96.6 kg)   SpO2 99%   BMI 38.34 kg/m   General:   Alert,  Well-developed, well-nourished, pleasant and cooperative in NAD Lungs:  Clear throughout to auscultation.   Heart:  Regular rate and rhythm; no murmurs, clicks, rubs,  or gallops. Abdomen:  Soft, nontender and nondistended. Normal bowel sounds.   Neuro/Psych:  Alert and cooperative. Normal mood and affect. A and O x 3   @Calix Heinbaugh  Tammie Fall, MD, [facility] [phone] (pager) 04/22/2024 11:37 AM@

## 2024-04-22 ENCOUNTER — Encounter: Payer: Self-pay | Admitting: Internal Medicine

## 2024-04-22 ENCOUNTER — Ambulatory Visit: Payer: Self-pay | Admitting: Internal Medicine

## 2024-04-22 VITALS — BP 99/74 | HR 81 | Temp 97.9°F | Resp 16 | Ht 62.5 in | Wt 213.0 lb

## 2024-04-22 DIAGNOSIS — Z1211 Encounter for screening for malignant neoplasm of colon: Secondary | ICD-10-CM | POA: Diagnosis present

## 2024-04-22 DIAGNOSIS — K5730 Diverticulosis of large intestine without perforation or abscess without bleeding: Secondary | ICD-10-CM

## 2024-04-22 MED ORDER — SODIUM CHLORIDE 0.9 % IV SOLN: 500.0000 mL | Freq: Once | INTRAVENOUS | Status: DC

## 2024-04-22 NOTE — Progress Notes (Deleted)
I have reviewed the patient's medical history in detail and updated the computerized patient record.

## 2024-04-22 NOTE — Progress Notes (Signed)
I have reviewed the patient's medical history in detail and updated the computerized patient record.

## 2024-04-22 NOTE — Progress Notes (Signed)
 Vss nad trans to pacu

## 2024-04-22 NOTE — Op Note (Signed)
 [facility] Patient Name: Courtney Powers Procedure Date: 04/22/2024 11:29 AM MRN: [id number] Endoscopist: Kenney Peacemaker , MD, [id number] Age: 48 Referring MD:  Date of Birth: [birth year] Gender: Female Account #: [id number] Procedure:                Colonoscopy Indications:              Screening for colorectal malignant neoplasm Medicines:                Monitored Anesthesia Care Procedure:                Pre-Anesthesia Assessment:                           - Prior to the procedure, a History and Physical                            was performed, and patient medications and                            allergies were reviewed. The patient's tolerance of                            previous anesthesia was also reviewed. The risks                            and benefits of the procedure and the sedation                            options and risks were discussed with the patient.                            All questions were answered, and informed consent                            was obtained. Prior Anticoagulants: The patient has                            taken no anticoagulant or antiplatelet agents. ASA                            Grade Assessment: II - A patient with mild systemic                            disease. After reviewing the risks and benefits,                            the patient was deemed in satisfactory condition to                            undergo the procedure.                           After obtaining informed consent, the colonoscope  was passed under direct vision. Throughout the                            procedure, the patient's blood pressure, pulse, and                            oxygen saturations were monitored continuously. The                            Olympus Scope [id number] was introduced through the                            anus and advanced to the the cecum, identified by                            appendiceal  orifice and ileocecal valve. The                            colonoscopy was performed without difficulty. The                            patient tolerated the procedure well. The quality                            of the bowel preparation was good. The ileocecal                            valve, appendiceal orifice, and rectum were                            photographed. The bowel preparation used was                            SUFLAVE  via split dose instruction. Scope In: 11:40:13 AM Scope Out: 11:49:57 AM Scope Withdrawal Time: 0 hours 7 minutes 29 seconds  Total Procedure Duration: 0 hours 9 minutes 44 seconds  Findings:                 The perianal and digital rectal examinations were                            normal.                           Multiple diverticula were found in the sigmoid                            colon.                           The exam was otherwise without abnormality on                            direct and retroflexion views. Complications:            No immediate complications. Estimated  Blood Loss:     Estimated blood loss: none. Impression:               - Diverticulosis in the sigmoid colon.                           - The examination was otherwise normal on direct                            and retroflexion views.                           - No specimens collected. Recommendation:           - Patient has a contact number available for                            emergencies. The signs and symptoms of potential                            delayed complications were discussed with the                            patient. Return to normal activities tomorrow.                            Written discharge instructions were provided to the                            patient.                           - Resume previous diet.                           - Continue present medications.                           - Repeat colonoscopy in 10 years for screening                             purposes. Kenney Peacemaker, MD 04/22/2024 11:56:19 AM This report has been signed electronically.

## 2024-04-22 NOTE — Patient Instructions (Addendum)
 No polyps or cancer were seen.  You do have diverticulosis - thickened muscle rings and pouches in the colon wall. Please read the handout about this condition.  Next routine colonoscopy or other screening test in 10 years - 2035.  I appreciate the opportunity to care for you. Kenney Peacemaker, MD, [facility]   **Handout given on diverticulosis**  YOU HAD AN ENDOSCOPIC PROCEDURE TODAY AT THE [facility]:   Refer to the procedure report that was given to you for any specific questions about what was found during the examination.  If the procedure report does not answer your questions, please call your gastroenterologist to clarify.  If you requested that your care partner not be given the details of your procedure findings, then the procedure report has been included in a sealed envelope for you to review at your convenience later.  YOU SHOULD EXPECT: Some feelings of bloating in the abdomen. Passage of more gas than usual.  Walking can help get rid of the air that was put into your GI tract during the procedure and reduce the bloating. If you had a lower endoscopy (such as a colonoscopy or flexible sigmoidoscopy) you may notice spotting of blood in your stool or on the toilet paper. If you underwent a bowel prep for your procedure, you may not have a normal bowel movement for a few days.  Please Note:  You might notice some irritation and congestion in your nose or some drainage.  This is from the oxygen used during your procedure.  There is no need for concern and it should clear up in a day or so.  SYMPTOMS TO REPORT IMMEDIATELY:  Following lower endoscopy (colonoscopy or flexible sigmoidoscopy):  Excessive amounts of blood in the stool  Significant tenderness or worsening of abdominal pains  Swelling of the abdomen that is new, acute  Fever of 100F or higher  For urgent or emergent issues, a gastroenterologist can be reached at any hour by calling [phone]. Do not use  MyChart messaging for urgent concerns.    DIET:  We do recommend a small meal at first, but then you may proceed to your regular diet.  Drink plenty of fluids but you should avoid alcoholic beverages for 24 hours.  ACTIVITY:  You should plan to take it easy for the rest of today and you should NOT DRIVE or use heavy machinery until tomorrow (because of the sedation medicines used during the test).    FOLLOW UP: Our staff will call the number listed on your records the next business day following your procedure.  We will call around 7:15- 8:00 am to check on you and address any questions or concerns that you may have regarding the information given to you following your procedure. If we do not reach you, we will leave a message.     If any biopsies were taken you will be contacted by phone or by letter within the next 1-3 weeks.  Please call us  at [phone] if you have not heard about the biopsies in 3 weeks.    SIGNATURES/CONFIDENTIALITY: You and/or your care partner have signed paperwork which will be entered into your electronic medical record.  These signatures attest to the fact that that the information above on your After Visit Summary has been reviewed and is understood.  Full responsibility of the confidentiality of this discharge information lies with you and/or your care-partner.

## 2024-04-25 ENCOUNTER — Telehealth: Payer: Self-pay

## 2024-04-25 NOTE — Telephone Encounter (Signed)
 Post procedure follow up call, no answer

## 2024-05-04 ENCOUNTER — Encounter (HOSPITAL_BASED_OUTPATIENT_CLINIC_OR_DEPARTMENT_OTHER): Payer: Self-pay | Admitting: Family Medicine

## 2024-05-04 ENCOUNTER — Encounter: Payer: Self-pay | Admitting: Internal Medicine

## 2024-05-09 ENCOUNTER — Ambulatory Visit (HOSPITAL_BASED_OUTPATIENT_CLINIC_OR_DEPARTMENT_OTHER): Admitting: Family Medicine

## 2024-05-17 ENCOUNTER — Encounter (HOSPITAL_BASED_OUTPATIENT_CLINIC_OR_DEPARTMENT_OTHER): Payer: Self-pay | Admitting: Family Medicine

## 2024-05-25 ENCOUNTER — Ambulatory Visit (HOSPITAL_BASED_OUTPATIENT_CLINIC_OR_DEPARTMENT_OTHER): Admitting: Family Medicine

## 2024-05-25 ENCOUNTER — Encounter (HOSPITAL_BASED_OUTPATIENT_CLINIC_OR_DEPARTMENT_OTHER): Payer: Self-pay | Admitting: Family Medicine

## 2024-05-25 VITALS — BP 132/88 | HR 89 | Ht 62.0 in | Wt 217.8 lb

## 2024-05-25 DIAGNOSIS — R42 Dizziness and giddiness: Secondary | ICD-10-CM | POA: Insufficient documentation

## 2024-05-25 DIAGNOSIS — I10 Essential (primary) hypertension: Secondary | ICD-10-CM | POA: Diagnosis not present

## 2024-05-25 NOTE — Progress Notes (Signed)
    Procedures performed today:    None.  Independent interpretation of notes and tests performed by another provider:   None.  Brief History, Exam, Impression, and Recommendations:    BP 132/88 (BP Location: Right Arm, Patient Position: Sitting, Cuff Size: Normal)   Pulse 89   Ht 5\' 2"  (1.575 m)   Wt 217 lb 12.8 oz (98.8 kg)   SpO2 98%   BMI 39.84 kg/m   Patient overdue for follow-up - last seen about 2 years ago with recommendation of follow-up about 1 month after that appointment, has not had an office visit since  Dizziness Assessment & Plan: Had colonoscopy about 1 month ago; has had dizziness since then.  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 Abx helped with pain but still with dizziness. Describes dizziness as "woozy" as if she has had a few drinks. Notices anytime she is moving her eyes. No difficulty with walking. Has been about the same since it started. No change in hearing or ear pain. Maybe hears some ear popping but thinks this is more chronic. No weakness or numbness. Has seen neurologist related to tremor issue in the past. No nausea or vomiting with ongoing symptoms. On exam, patient is in no acute distress, vital signs stable.  Cardiovascular exam regular rate and rhythm, no murmur appreciated, lungs clear to auscultation bilaterally.  Extraocular movements intact, pupils are equal, round, reactive to light and accommodation.  Normal gait in office. We discussed considerations related to ongoing symptoms.  She is established with neurology related to other issues with tremor in the past.  I do feel that further evaluation with neurology would be reasonable.  Could also consider evaluation with ENT, however discussed that if she is able to get in with neurology sooner and is having evaluation with them, likely can hold off on appointment with ENT.  Orders: -     CBC  with Differential/Platelet -     Comprehensive metabolic panel with GFR -     Ambulatory referral to ENT -     Ambulatory referral to Neurology  Primary hypertension Assessment & Plan: Blood pressure borderline in office today.  Is improved compared to last office visit a couple years ago, however still not at goal.  She continues with lisinopril  at this time, denies any issues with medication. For now, we can continue current medication regimen while proceeding with evaluation of above.  Likely we will need to adjust medication with either increase in dose of lisinopril  or addition of alternative agent at low-dose.  Will plan for close follow-up in about 6 to 8 weeks to monitor and review need changes to medication regimen  Orders: -     CBC with Differential/Platelet -     Comprehensive metabolic panel with GFR  Return in about 6 weeks (around 07/06/2024) for hypertension.   ___________________________________________ Delynda Sepulveda de Peru, MD, ABFM, CAQSM Primary Care and Sports Medicine [facility]

## 2024-05-25 NOTE — Patient Instructions (Signed)
  Medication Instructions:  Your physician recommends that you continue on your current medications as directed. Please refer to the Current Medication list given to you today. --If you need a refill on any your medications before your next appointment, please call your pharmacy first. If no refills are authorized on file call the office.-- Lab Work: Your physician has recommended that you have lab work today: today If you have labs (blood work) drawn today and your tests are completely normal, you will receive your results via MyChart message OR a phone call from our staff.  Please ensure you check your voicemail in the event that you authorized detailed messages to be left on a delegated number. If you have any lab test that is abnormal or we need to change your treatment, we will call you to review the results.  Referrals/Procedures/Imaging: Referrals placed   Follow-Up: Your next appointment:   Your physician recommends that you schedule a follow-up appointment in: 6-8 week follow up  with Dr. de Peru  You will receive a text message or e-mail with a link to a survey about your care and experience with us  today! We would greatly appreciate your feedback!   Thanks for letting us  be apart of your health journey!!  Primary Care and Sports Medicine   Dr. Court Distance Peru   We encourage you to activate your patient portal called "MyChart".  Sign up information is provided on this After Visit Summary.  MyChart is used to connect with patients for Virtual Visits (Telemedicine).  Patients are able to view lab/test results, encounter notes, upcoming appointments, etc.  Non-urgent messages can be sent to your provider as well. To learn more about what you can do with MyChart, please visit --  [email or web address].

## 2024-05-25 NOTE — Assessment & Plan Note (Signed)
 Blood pressure borderline in office today.  Is improved compared to last office visit a couple years ago, however still not at goal.  She continues with lisinopril  at this time, denies any issues with medication. For now, we can continue current medication regimen while proceeding with evaluation of above.  Likely we will need to adjust medication with either increase in dose of lisinopril  or addition of alternative agent at low-dose.  Will plan for close follow-up in about 6 to 8 weeks to monitor and review need changes to medication regimen

## 2024-05-25 NOTE — Assessment & Plan Note (Signed)
 Had colonoscopy about 1 month ago; has had dizziness since then.  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 Abx helped with pain but still with dizziness. Describes dizziness as "woozy" as if she has had a few drinks. Notices anytime she is moving her eyes. No difficulty with walking. Has been about the same since it started. No change in hearing or ear pain. Maybe hears some ear popping but thinks this is more chronic. No weakness or numbness. Has seen neurologist related to tremor issue in the past. No nausea or vomiting with ongoing symptoms. On exam, patient is in no acute distress, vital signs stable.  Cardiovascular exam regular rate and rhythm, no murmur appreciated, lungs clear to auscultation bilaterally.  Extraocular movements intact, pupils are equal, round, reactive to light and accommodation.  Normal gait in office. We discussed considerations related to ongoing symptoms.  She is established with neurology related to other issues with tremor in the past.  I do feel that further evaluation with neurology would be reasonable.  Could also consider evaluation with ENT, however discussed that if she is able to get in with neurology sooner and is having evaluation with them, likely can hold off on appointment with ENT.

## 2024-05-26 ENCOUNTER — Ambulatory Visit (HOSPITAL_BASED_OUTPATIENT_CLINIC_OR_DEPARTMENT_OTHER): Payer: Self-pay | Admitting: Family Medicine

## 2024-05-26 LAB — CBC WITH DIFFERENTIAL/PLATELET
Basophils Absolute: 0 10*3/uL (ref 0.0–0.2)
Basos: 1 %
EOS (ABSOLUTE): 0.1 10*3/uL (ref 0.0–0.4)
Eos: 1 %
Hematocrit: 42.8 % (ref 34.0–46.6)
Hemoglobin: 14.3 g/dL (ref 11.1–15.9)
Immature Grans (Abs): 0 10*3/uL (ref 0.0–0.1)
Immature Granulocytes: 1 %
Lymphocytes Absolute: 2.4 10*3/uL (ref 0.7–3.1)
Lymphs: 33 %
MCH: 32 pg (ref 26.6–33.0)
MCHC: 33.4 g/dL (ref 31.5–35.7)
MCV: 96 fL (ref 79–97)
Monocytes Absolute: 0.4 10*3/uL (ref 0.1–0.9)
Monocytes: 5 %
Neutrophils Absolute: 4.3 10*3/uL (ref 1.4–7.0)
Neutrophils: 59 %
Platelets: 229 10*3/uL (ref 150–450)
RBC: 4.47 x10E6/uL (ref 3.77–5.28)
RDW: 12.5 % (ref 11.7–15.4)
WBC: 7.1 10*3/uL (ref 3.4–10.8)

## 2024-05-26 LAB — COMPREHENSIVE METABOLIC PANEL WITH GFR
ALT: 22 IU/L (ref 0–32)
AST: 26 IU/L (ref 0–40)
Albumin: 4.3 g/dL (ref 3.9–4.9)
Alkaline Phosphatase: 82 IU/L (ref 44–121)
BUN/Creatinine Ratio: 10 (ref 9–23)
BUN: 8 mg/dL (ref 6–24)
Bilirubin Total: 0.5 mg/dL (ref 0.0–1.2)
CO2: 21 mmol/L (ref 20–29)
Calcium: 9.2 mg/dL (ref 8.7–10.2)
Chloride: 102 mmol/L (ref 96–106)
Creatinine, Ser: 0.79 mg/dL (ref 0.57–1.00)
Globulin, Total: 2.1 g/dL (ref 1.5–4.5)
Glucose: 111 mg/dL — ABNORMAL HIGH (ref 70–99)
Potassium: 4.5 mmol/L (ref 3.5–5.2)
Sodium: 138 mmol/L (ref 134–144)
Total Protein: 6.4 g/dL (ref 6.0–8.5)
eGFR: 94 mL/min/{1.73_m2} (ref >59)

## 2024-05-31 ENCOUNTER — Encounter (HOSPITAL_BASED_OUTPATIENT_CLINIC_OR_DEPARTMENT_OTHER): Payer: Self-pay | Admitting: Family Medicine

## 2024-06-01 ENCOUNTER — Encounter (INDEPENDENT_AMBULATORY_CARE_PROVIDER_SITE_OTHER): Payer: Self-pay | Admitting: Otolaryngology

## 2024-06-03 ENCOUNTER — Telehealth (INDEPENDENT_AMBULATORY_CARE_PROVIDER_SITE_OTHER): Admitting: Family Medicine

## 2024-06-03 ENCOUNTER — Encounter (HOSPITAL_BASED_OUTPATIENT_CLINIC_OR_DEPARTMENT_OTHER): Payer: Self-pay | Admitting: Family Medicine

## 2024-06-03 DIAGNOSIS — E66812 Obesity, class 2: Secondary | ICD-10-CM

## 2024-06-03 DIAGNOSIS — E785 Hyperlipidemia, unspecified: Secondary | ICD-10-CM

## 2024-06-03 DIAGNOSIS — I10 Essential (primary) hypertension: Secondary | ICD-10-CM

## 2024-06-03 DIAGNOSIS — R87619 Unspecified abnormal cytological findings in specimens from cervix uteri: Secondary | ICD-10-CM | POA: Insufficient documentation

## 2024-06-03 MED ORDER — WEGOVY 0.25 MG/0.5ML ~~LOC~~ SOAJ: 0.2500 mg | SUBCUTANEOUS | 1 refills | Status: AC

## 2024-06-03 NOTE — Progress Notes (Signed)
   Virtual Visit  I connected with  Courtney Powers  on 06/03/24 by telephone and verified that I am speaking with the correct person using two identifiers. Visit completed via telephone.  I discussed the limitations, risks, security and privacy concerns of performing an evaluation and management service by telephone, including the higher likelihood of inaccurate diagnosis and treatment, and the availability of in person appointments.  We also discussed the likely need of an additional face to face encounter for complete and high quality delivery of care.  I also discussed with the patient that there may be a patient responsible charge related to this service. The patient expressed understanding and wishes to proceed.  Provider location is in medical facility. Patient location is at their home, different from provider location. People involved in care of the patient during this telehealth encounter were myself, my nurse/medical assistant, and my front office/scheduling team member.  Review of Systems: No fevers, chills, night sweats, weight loss, chest pain, or shortness of breath.   Objective Findings:    General: Speaking full sentences, no audible heavy breathing.  Sounds alert and appropriately interactive.    Independent interpretation of tests performed by another provider:   None.  Brief History, Exam, Impression, and Recommendations:    Obesity, Class II, BMI 35-39.9 Long discussion today reviewing weight loss medications, specifically patient with interest in injectable options including GLP-1 receptor agonist.  We discussed potential risk, benefits, cost associated with these various medications as well as the possibility of insurance coverage or lack thereof.  We discussed typical dosing regimen. After discussion of potential risks and adverse reactions with these medications and potential benefits of each, patient would like to proceed with injectable GLP-1 receptor agonist if  possible.  Prescription sent to pharmacy on file, if medication is cost prohibitive for patient, she will let us  know and we can look to send an alternative option. Will plan for close follow-up to monitor response to whichever medication patient is able to initiate. Additional consideration is for referral to healthy weight and wellness clinic She will contact us  once she receives medication to schedule 1 month follow-up  Not able to complete virtual visit scheduled as patient is located within [place] .  I discussed the above assessment and treatment plan with the patient. The patient was provided an opportunity to ask questions and all were answered. The patient agreed with the plan and demonstrated an understanding of the instructions.  The patient was advised to call back or seek an in-person evaluation if the symptoms worsen or if the condition fails to improve as anticipated.  I provided 17 minutes of face to face and non-face-to-face time during this encounter date, time was needed to gather information, review chart, records, communicate/coordinate with staff remotely, as well as complete documentation.   ___________________________________________ Krislynn Gronau de Peru, MD, ABFM, CAQSM Primary Care and Sports Medicine [facility]

## 2024-06-03 NOTE — Assessment & Plan Note (Addendum)
 Long discussion today reviewing weight loss medications, specifically patient with interest in injectable options including GLP-1 receptor agonist.  We discussed potential risk, benefits, cost associated with these various medications as well as the possibility of insurance coverage or lack thereof.  We discussed typical dosing regimen. After discussion of potential risks and adverse reactions with these medications and potential benefits of each, patient would like to proceed with injectable GLP-1 receptor agonist if possible.  Prescription sent to pharmacy on file, if medication is cost prohibitive for patient, she will let us  know and we can look to send an alternative option. Will plan for close follow-up to monitor response to whichever medication patient is able to initiate. Additional consideration is for referral to healthy weight and wellness clinic She will contact us  once she receives medication to schedule 1 month follow-up

## 2024-06-08 ENCOUNTER — Telehealth (HOSPITAL_BASED_OUTPATIENT_CLINIC_OR_DEPARTMENT_OTHER): Payer: Self-pay | Admitting: *Deleted

## 2024-06-08 NOTE — Telephone Encounter (Signed)
 Will await prior authorization

## 2024-06-08 NOTE — Telephone Encounter (Signed)
 Copied from CRM [id number]. Topic: General - Other >> Jun 08, 2024  9:57 AM Hobson Luna F wrote: Reason for CRM: Caretha Chapel with [employer] is calling in because he is sending a fax with appeal information for WeGovy  2.5 MG. Ref Key: Tomas Fountain

## 2024-06-30 ENCOUNTER — Encounter (INDEPENDENT_AMBULATORY_CARE_PROVIDER_SITE_OTHER): Payer: Self-pay

## 2024-07-27 ENCOUNTER — Ambulatory Visit: Payer: Self-pay | Admitting: Diagnostic Neuroimaging

## 2024-07-27 ENCOUNTER — Encounter: Payer: Self-pay | Admitting: Diagnostic Neuroimaging

## 2024-07-27 VITALS — BP 144/98 | HR 90 | Ht 62.0 in | Wt 216.2 lb

## 2024-07-27 DIAGNOSIS — R4189 Other symptoms and signs involving cognitive functions and awareness: Secondary | ICD-10-CM

## 2024-07-27 DIAGNOSIS — R0683 Snoring: Secondary | ICD-10-CM | POA: Diagnosis not present

## 2024-07-27 DIAGNOSIS — R42 Dizziness and giddiness: Secondary | ICD-10-CM | POA: Diagnosis not present

## 2024-07-27 DIAGNOSIS — Z6841 Body Mass Index (BMI) 40.0 and over, adult: Secondary | ICD-10-CM | POA: Diagnosis not present

## 2024-07-27 NOTE — Patient Instructions (Addendum)
  Intermittent dizziness / brain fog (since colonoscopy in March 2025) - unclear cause; try to optimize sleep, nutrition, exercise  Intermittent hand numbness (wakes up from sleep) - follow up with neurosurgery re: cervical spine issues - consider EMG/NCS (but patient not interested in surgery at this time) - consider wrist splints at bedtime

## 2024-07-27 NOTE — Progress Notes (Signed)
 [facility]  PATIENT: Courtney Powers DOB: Oct 05, 1976  REFERRING CLINICIAN: de [place], Quintin PARAS, MD HISTORY FROM: patient  REASON FOR VISIT: new consult    HISTORICAL  CHIEF COMPLAINT:  Chief Complaint  Patient presents with   Dizziness    RM 7, Pt alone. Pt was last seen in 2023 for a different issue. Pt states the past issues are still occurring but now she has dizziness. States it is one to two times a week. Started after colonoscopy in March and it has occurred once to two times a week since. Pt states it does not matter what she is doing and it affects her vision as well mostly in left eye. Pt states has not seen optometrist since dizziness started.    HISTORY OF PRESENT ILLNESS:   UPDATE (07/27/24, VRP): Since last visit, had colonoscopy in March 2025, then post-procedure felt dizziness and fogginess for a few days. Continues with this intermittently 1-2 x per week, lasting hours at a time. Not sleeping that well. Some snoring. Also with numbness in hands and low back pain that wakes her up from sleep.   Also since last visit saw neurosurgery, but they recommended conservative mgmt for cervical spine issues.    PRIOR HPI (08/18/22, VRP): 48 year old female here for evaluation of tremors and muscle twitching.  Symptoms started around 6 months ago.  Initially patient had intermittent symptoms in her left arm and hand with muscle twitching, jerking and tremors, sometimes numbness.  Also having some symptoms in left leg as well.  Also has intermittent numbness in bilateral upper and lower extremities.  Has been noting some increasing urinary urgency and incontinence with coughing.  Has had some intermittent joint pain and ANA abnormalities in the past, saw a rheumatology but no specific diagnosis was found.  Previously seen around 10 years ago by Dr. Jenel for migraine management.  Doing well on Imitrex .   REVIEW OF SYSTEMS: Full 14 system review of systems  performed and negative with exception of: as per HPI.  ALLERGIES: No Known Allergies  HOME MEDICATIONS: Outpatient Medications Prior to Visit  Medication Sig Dispense Refill   clobetasol (TEMOVATE) 0.05 % external solution      cyclobenzaprine  (FLEXERIL ) 5 MG tablet Take 5 mg by mouth 2 (two) times daily.     lisinopril  (ZESTRIL ) 10 MG tablet Take 1 tablet (10 mg total) by mouth daily. 90 tablet 3   metroNIDAZOLE (METROGEL) 0.75 % gel Apply topically.     Multiple Vitamin (MULTIVITAMIN) tablet Take 1 tablet by mouth daily.     omeprazole  (PRILOSEC) 20 MG capsule TAKE 1 CAPSULE(20 MG) BY MOUTH DAILY 90 capsule 1   SUMAtriptan  (IMITREX ) 100 MG tablet TAKE 1 TABLET BY MOUTH AS NEEDED FOR MIGRAINE. MAY REPEAT IN 2 HOURS IF HEADACHE PERSISTS OR RECURS. DO NOT EXCEED 2 DOSES IN 24 HOURS 10 tablet 3   valACYclovir (VALTREX) 500 MG tablet Take 1 tablet every day by oral route.     Omega-3 Fatty Acids (FISH OIL) 1200 MG CAPS Take by mouth. (Patient not taking: Reported on 07/27/2024)     Semaglutide -Weight Management (WEGOVY ) 0.25 MG/0.5ML SOAJ Inject 0.25 mg into the skin once a week. Use this dose for 1 month (4 shots) and then increase to next higher dose. (Patient not taking: Reported on 07/27/2024) 2 mL 1   No facility-administered medications prior to visit.    PAST MEDICAL HISTORY: Past Medical History:  Diagnosis Date   Allergy    Anxiety  Depression    Hyperlipidemia    Hypertension    Migraine     PAST SURGICAL HISTORY: Past Surgical History:  Procedure Laterality Date   BUNIONECTOMY Right    Right foot   CESAREAN SECTION     COLONOSCOPY  02/2024   TUBAL LIGATION      FAMILY HISTORY: Family History  Problem Relation Age of Onset   Heart disease Mother    Hypertension Mother    Hypertension Father    Diabetes Father    Breast cancer Paternal Aunt 80 - 84   Alzheimer's disease Maternal Grandmother    Heart disease Maternal Grandfather    Cancer Paternal Grandmother     Stroke Paternal Grandmother    Hyperlipidemia Paternal Grandfather    Diabetes Paternal Grandfather    BRCA 1/2 Neg Hx    Colon polyps Neg Hx    Colon cancer Neg Hx    Esophageal cancer Neg Hx    Rectal cancer Neg Hx    Stomach cancer Neg Hx     SOCIAL HISTORY: Social History   Socioeconomic History   Marital status: Married    Spouse name: Not on file   Number of children: 3   Years of education: Not on file   Highest education level: High school graduate  Occupational History   Not on file  Tobacco Use   Smoking status: Former    Current packs/day: 0.00    Types: Cigarettes    Quit date: 08/21/2013    Years since quitting: 10.9    Passive exposure: Past   Smokeless tobacco: Never   Tobacco comments:    08/18/22 avge 10 cigs weekly  Vaping Use   Vaping status: Every Day   Substances: Nicotine  Substance and Sexual Activity   Alcohol use: Not Currently    Comment: Social drinking - 1 per month   Drug use: No   Sexual activity: Yes    Partners: Male    Birth control/protection: Surgical    Comment: tubal  Other Topics Concern   Not on file  Social History Narrative   Lives with husband   Social Drivers of Corporate investment banker Strain: Not on file  Food Insecurity: Not on file  Transportation Needs: Not on file  Physical Activity: Not on file  Stress: Not on file  Social Connections: Not on file  Intimate Partner Violence: Not on file     PHYSICAL EXAM  GENERAL EXAM/CONSTITUTIONAL: Vitals:  Vitals:   07/27/24 1001  BP: (!) 144/98  Pulse: 90  Weight: 216 lb 3.2 oz (98.1 kg)  Height: 5' 2 (1.575 m)   Body mass index is 39.54 kg/m. Wt Readings from Last 3 Encounters:  07/27/24 216 lb 3.2 oz (98.1 kg)  05/25/24 217 lb 12.8 oz (98.8 kg)  04/22/24 213 lb (96.6 kg)   Patient is in no distress; well developed, nourished and groomed; neck is supple  CARDIOVASCULAR: Examination of carotid arteries is normal; no carotid bruits Regular rate  and rhythm, no murmurs Examination of peripheral vascular system by observation and palpation is normal  EYES: Ophthalmoscopic exam of optic discs and posterior segments is normal; no papilledema or hemorrhages No results found.  MUSCULOSKELETAL: Gait, strength, tone, movements noted in Neurologic exam below  NEUROLOGIC: MENTAL STATUS:      No data to display         awake, alert, oriented to person, place and time recent and remote memory intact normal attention and concentration language  fluent, comprehension intact, naming intact fund of knowledge appropriate  CRANIAL NERVE:  2nd - no papilledema on fundoscopic exam 2nd, 3rd, 4th, 6th - pupils equal and reactive to light, visual fields full to confrontation, extraocular muscles intact, no nystagmus 5th - facial sensation symmetric 7th - facial strength symmetric 8th - hearing intact 9th - palate elevates symmetrically, uvula midline 11th - shoulder shrug symmetric 12th - tongue protrusion midline  MOTOR:  normal bulk and tone, full strength in the BUE, BLE  SENSORY:  normal and symmetric to light touch, temperature, vibration  COORDINATION:  finger-nose-finger, fine finger movements normal  REFLEXES:  deep tendon reflexes present and symmetric  GAIT/STATION:  narrow based gait     DIAGNOSTIC DATA (LABS, IMAGING, TESTING) - I reviewed patient records, labs, notes, testing and imaging myself where available.  Lab Results  Component Value Date   WBC 7.1 05/25/2024   HGB 14.3 05/25/2024   HCT 42.8 05/25/2024   MCV 96 05/25/2024   PLT 229 05/25/2024      Component Value Date/Time   NA 138 05/25/2024 1136   K 4.5 05/25/2024 1136   CL 102 05/25/2024 1136   CO2 21 05/25/2024 1136   GLUCOSE 111 (H) 05/25/2024 1136   GLUCOSE 77 05/03/2010 1135   BUN 8 05/25/2024 1136   CREATININE 0.79 05/25/2024 1136   CALCIUM 9.2 05/25/2024 1136   PROT 6.4 05/25/2024 1136   ALBUMIN 4.3 05/25/2024 1136   AST 26  05/25/2024 1136   ALT 22 05/25/2024 1136   ALKPHOS 82 05/25/2024 1136   BILITOT 0.5 05/25/2024 1136   GFRNONAA >60 05/03/2010 1135   GFRAA  05/03/2010 1135    >60        The eGFR has been calculated using the MDRD equation. This calculation has not been validated in all clinical situations. eGFR's persistently <60 mL/min signify possible Chronic Kidney Disease.   Lab Results  Component Value Date   CHOL 231 (H) 06/09/2022   HDL 71 06/09/2022   LDLCALC 132 (H) 06/09/2022   TRIG 160 (H) 06/09/2022   CHOLHDL 3.3 06/09/2022   Lab Results  Component Value Date   HGBA1C 5.1 06/09/2022   Lab Results  Component Value Date   VITAMINB12 426 08/18/2022   Lab Results  Component Value Date   TSH 1.100 06/09/2022    01/06/19 MRI lumbar spine 1. Transitional lumbosacral vertebra numbered L5. Spinal radiography could alter spinal numbering. 2. L3-4 and L4-5 facet degeneration, greater at L3-4 where there is slight anterolisthesis. 3. L4-5 disc degeneration. 4. No impingement throughout the lumbar spine.  09/02/22 MRI brain - normal  09/02/22 MRI cervical spine (with and without) demonstrating:  - At C5-6 disc bulging and uncovertebral joint hypertrophy with mild  spinal stenosis and moderate to severe bilateral foraminal stenosis.  - At C6-7 disc bulging and uncovertebral hypertrophy with mild spinal  stenosis and moderate left foraminal stenosis.  - No abnormal enhancing or intrinsic spinal cord lesions.    ASSESSMENT AND PLAN  48 y.o. year old female here with:   Dx:  1. Dizziness   2. Brain fog   3. BMI 40.0-44.9, adult (HCC)   4. Snoring     PLAN:  Intermittent dizziness / brain fog (since colonoscopy in March 2025) - unclear cause; try to optimize sleep, nutrition, exercise - check sleep study (snoring, daytime sleepiness, BMI 40)  Intermittent hand numbness (wakes up from sleep) - follow up with neurosurgery re: cervical spine issues - consider EMG/NCS  (  but patient not interested in surgery at this time) - consider wrist splints at bedtime  Migraine without aura - stable; previously on sumatriptan ; monitor for now  Orders Placed This Encounter  Procedures   Ambulatory referral to Sleep Studies   Return for pending if symptoms worsen or fail to improve, pending test results.    EDUARD FABIENE HANLON, MD 07/27/2024, 10:59 AM Certified in Neurology, Neurophysiology and Neuroimaging  [facility] [address], [address] [place], [place] [address] [phone]

## 2024-08-02 ENCOUNTER — Ambulatory Visit (HOSPITAL_BASED_OUTPATIENT_CLINIC_OR_DEPARTMENT_OTHER): Admitting: Family Medicine

## 2024-10-13 ENCOUNTER — Other Ambulatory Visit (HOSPITAL_BASED_OUTPATIENT_CLINIC_OR_DEPARTMENT_OTHER): Payer: Self-pay | Admitting: Family Medicine

## 2024-10-13 DIAGNOSIS — I10 Essential (primary) hypertension: Secondary | ICD-10-CM
